# Patient Record
Sex: Male | Born: 1950 | Race: White | Hispanic: No | Marital: Single | Smoking: Never smoker
Health system: Southern US, Community
[De-identification: ages and names within clinical notes are randomized; demographics above are authoritative.]

## PROBLEM LIST (undated history)

## (undated) DIAGNOSIS — E785 Hyperlipidemia, unspecified: Secondary | ICD-10-CM

## (undated) DIAGNOSIS — C61 Malignant neoplasm of prostate: Secondary | ICD-10-CM

## (undated) DIAGNOSIS — I4891 Unspecified atrial fibrillation: Secondary | ICD-10-CM

## (undated) DIAGNOSIS — I10 Essential (primary) hypertension: Secondary | ICD-10-CM

## (undated) DIAGNOSIS — I509 Heart failure, unspecified: Secondary | ICD-10-CM

## (undated) DIAGNOSIS — M25561 Pain in right knee: Secondary | ICD-10-CM

## (undated) DIAGNOSIS — M1711 Unilateral primary osteoarthritis, right knee: Secondary | ICD-10-CM

## (undated) DIAGNOSIS — D122 Benign neoplasm of ascending colon: Secondary | ICD-10-CM

## (undated) DIAGNOSIS — N200 Calculus of kidney: Secondary | ICD-10-CM

## (undated) HISTORY — DX: Malignant neoplasm of prostate: C61

---

## 2012-03-12 LAB — AMB POC URINALYSIS DIP STICK AUTO W/O MICRO
Bilirubin (UA POC): NEGATIVE
Glucose (UA POC): NEGATIVE
Ketones (UA POC): NEGATIVE
Leukocyte esterase (UA POC): NEGATIVE
Nitrites (UA POC): NEGATIVE
Specific gravity (UA POC): 1.02 (ref 1.001–1.035)
Urobilinogen (UA POC): 0.2 (ref 0.2–1)
pH (UA POC): 6 (ref 4.6–8.0)

## 2012-03-12 MED ORDER — OXYCODONE-ACETAMINOPHEN 5 MG-325 MG TAB
5-325 mg | ORAL_TABLET | Freq: Four times a day (QID) | ORAL | Status: DC | PRN
Start: 2012-03-12 — End: 2017-11-30

## 2012-03-14 LAB — CULTURE, URINE
Urine Culture, Routine: NO GROWTH
Urine Culture, Routine: NO GROWTH

## 2012-03-15 ENCOUNTER — Encounter

## 2012-03-15 LAB — AMB POC URINALYSIS DIP STICK AUTO W/O MICRO
Glucose (UA POC): NEGATIVE
Ketones (UA POC): NEGATIVE
Leukocyte esterase (UA POC): NEGATIVE
Nitrites (UA POC): NEGATIVE
Specific gravity (UA POC): 1.03 (ref 1.001–1.035)
Urobilinogen (UA POC): 0.2 (ref 0.2–1)
pH (UA POC): 6 (ref 4.6–8.0)

## 2012-03-15 LAB — URINE CYTOLOGY

## 2012-03-15 NOTE — Progress Notes (Signed)
Assessment/Plan:             1. Calculus of kidney  AMB POC URINALYSIS DIP STICK AUTO W/O MICRO, AMB POC XRAY, ABDOMEN; SINGLE ANTEROP, CULTURE, URINE, URINE CYTOLOGY, CT ABD PELV WO CONT   2. Hematuria, unspecified  CULTURE, URINE, URINE CYTOLOGY, CT ABD PELV WO CONT     Pt to get a CT and F/U on Monday      Subjective:   Antonio Perez is a 61 y.o. Caucasian male who is referred by:  Nathaniel Man, MD for evaluation and treatment of kidney stone.  Pt having left flank pain, and gross hematuria.  He does have a prior hx of kidney stones, as well as prostate cancer.  He is on Xaralto and according to his cardiologist he will need to be off it for 2 days prior to any surgery.  No recent films except for a Renal US.    There is no problem list on file for this patient.    There are no active problems to display for this patient.    Current Outpatient Prescriptions   Medication Sig Dispense Refill   ??? rivaroxaban (XARELTO) 20 mg Tab tablet Take  by mouth daily.       ??? carvedilol (COREG) 25 mg tablet Take 25 mg by mouth two (2) times daily (with meals).       ??? oxyCODONE-acetaminophen (PERCOCET) 5-325 mg per tablet Take 1 Tab by mouth every six (6) hours as needed for Pain.  45 Tab  0     Allergies   Allergen Reactions   ??? Other Other (comments)     Seasonal allergies     Past Medical History   Diagnosis Date   ??? Essential hypertension    ??? Atrial fibrillation 2011   ??? Prostate cancer 2000     Past Surgical History   Procedure Date   ??? Hx prostatectomy 2000     [place], [place]     History reviewed. No pertinent family history.  History   Substance Use Topics   ??? Smoking status: Never Smoker    ??? Smokeless tobacco: Not on file   ??? Alcohol Use: Yes      occ        Review of Systems  Const: Neg for fever, neg for chills, neg for weight loss, neg for change in appetite, neg for changes in energy level  Eyes: Neg for visual disturbance, neg for pain, neg for discharge  ENT: Neg for difficulty speaking, neg for pain with  swallowing, neg for hearing difficulty  Resp: Neg for Shortness of breath, neg for cough, neg for sputum, neg for hemoptysis  Cardio: Neg for chest pain, neg for rapid heartbeat, neg for irregular heartbeat  GU: Neg for history of kidney stones, neg for frequent UTI, neg for flank pain  GI: Neg for constipation, neg for diarrhea, neg for melena, neg for hematochezia  MSK: Neg for bone pain, neg for muscular weakness, neg for muscular tenderness  Skin: Neg for skin conditions, neg for skin rashes  Neuro: Neg for focal weakness, neg for numbness, neg for seizures  Psych: Neg for history of psychiatric illness  Endo: Neg for diabetes, neg for thyroid disease, neg for excessive urination, neg for excessive thirst, neg for heat intolerance  Lymph: Neg for frequent infections, neg for easy bruising, neg for  lymph node enlargement    Objective:     Estimated Body mass index is 28.76 kg/(m^2) as  calculated from the following:    Height as of this encounter: 6\' 2" (1.88 m).    Weight as of this encounter: 224 lb(101.606 kg).   BP 116/80   Ht 6\' 2"  (1.88 m)   Wt 224 lb (101.606 kg)   BMI 28.76 kg/m2  General appearance: alert, cooperative, no distress, appears stated age  Head: Normocephalic, without obvious abnormality, atraumatic  Neck: supple, symmetrical, trachea midline and no JVD  Back: symmetric, no curvature. ROM normal. No CVA tenderness.  Abdomen: normal findings: soft, non-tender  Extremities: extremities normal, atraumatic, no cyanosis or edema  Skin: Skin color, texture, turgor normal. No rashes or lesions  Neurologic: Grossly normal    Review of Labs, Medical Images, tracings or specimens:    Labs reviewed:   UA  Results for orders placed in visit on 03/12/12   AMB POC URINALYSIS DIP STICK AUTO W/O MICRO       Component Value Range    Color Yellow  (none)    Clarity Clear  (none)    Glucose Negative  (none)    Bilirubin Negative  (none)    Ketones Negative  (none)    Spec.Grav. 1.020  1.001 - 1.035    Blood 4+   (none)    pH 6.0  4.6 - 8.0    Protein, Urine 1+  Negative mg/dL    Urobilinogen 0.2 mg/dL  0.2 - 1    Nitrites Negative  (none)    Leukocyte esterase Negative  (none)   CULTURE, URINE       Component Value Range    Urine Culture, Routine No growth       Lytle Butte, NP

## 2012-03-15 NOTE — Progress Notes (Addendum)
Assessment/Plan:             1. Calculus of kidney  AMB POC URINALYSIS DIP STICK AUTO W/O MICRO   2. Hematuria  AMB POC URINALYSIS DIP STICK AUTO W/O MICRO     Went over films with Dr. Tye [place]  Pt to be set up for cysto, retrogrades, laserlitho, JJ stent left with Dr. Louretta Parma on Thursday  Pt understands that if the stone is embedded in the ureter a stent will be inserted and the stone taken care of at a later date  Risks of bleeding, infection and anesthesia explained  All questions answered         Subjective:   Antonio Perez is a 61 y.o. Caucasian male who is referred by:  Nathaniel Man, MD for evaluation and treatment of kidney stone.  Pt having left flank pain, and gross hematuria.  He does have a prior hx of kidney stones, as well as prostate cancer.  He is on Xaralto and according to his cardiologist he will need to be off it for 1 days prior to any surgery.  No recent films except for a Renal US.    CT   Collected Date & Time: 03/12/12 15:12      Result Name Results Units Reference Range   CT ABDOMEN/PELVIS W/O CONTRAST CAT 7160 - CT  ABDOMEN/PELVIS W(..)   CAT 7160 - CT ABDOMEN/PELVIS W/O CONTRAST - Mar 12 2012     RESULTS: History: Left flank pain. History of kidney stones and  prostate   carcinoma.      IMPRESSION: 15.7 mm left renal pelvic nonobstructing calculus.   Prostatectomy. Diverticulosis.    Comment: CT images of the abdomen and pelvis were obtained  without oral   or intravenous contrast. There no prior studies for comparison.    The lung bases are clear.    The liver is of mild low attenuation from fatty infiltration.    The spleen pancreas adrenal glands and gallbladder are  unremarkable.    A 15.7 x 6.1 mm left renal pelvic calculus is present. The there  is   slight prominence of the renal pelvis but no hydronephrosis. The  right   kidney, both ureters and bladder are unremarkable.    A prostatectomy has been performed.    Diverticula are noted in the sigmoid colon without  diverticulitis,  bowel   obstruction, free intraperitoneal air or free fluid.    Interpreting Physician: Odelia Gage III M.D.   Electronically Signed by / Date: Odelia Gage III M.D. Mar 12 2012         There is no problem list on file for this patient.    There are no active problems to display for this patient.    Current Outpatient Prescriptions   Medication Sig Dispense Refill   ??? rivaroxaban (XARELTO) 20 mg Tab tablet Take  by mouth daily.       ??? carvedilol (COREG) 25 mg tablet Take 25 mg by mouth two (2) times daily (with meals).       ??? oxyCODONE-acetaminophen (PERCOCET) 5-325 mg per tablet Take 1 Tab by mouth every six (6) hours as needed for Pain.  45 Tab  0     Allergies   Allergen Reactions   ??? Other Other (comments)     Seasonal allergies     Past Medical History   Diagnosis Date   ??? Essential hypertension    ??? Atrial fibrillation 2011   ???  Prostate cancer 2000     Past Surgical History   Procedure Date   ??? Hx prostatectomy 2000     , [place]     History reviewed. No pertinent family history.  History   Substance Use Topics   ??? Smoking status: Never Smoker    ??? Smokeless tobacco: Not on file   ??? Alcohol Use: Yes      occ        Review of Systems  Const: Neg for fever, neg for chills, neg for weight loss, neg for change in appetite, neg for changes in energy level  Eyes: Neg for visual disturbance, neg for pain, neg for discharge  ENT: Neg for difficulty speaking, neg for pain with swallowing, neg for hearing difficulty  Resp: Neg for Shortness of breath, neg for cough, neg for sputum, neg for hemoptysis  Cardio: Neg for chest pain, neg for rapid heartbeat, neg for irregular heartbeat  GU: Neg for history of kidney stones, neg for frequent UTI, neg for flank pain  GI: Neg for constipation, neg for diarrhea, neg for melena, neg for hematochezia  MSK: Neg for bone pain, neg for muscular weakness, neg for muscular tenderness  Skin: Neg for skin conditions, neg for skin rashes  Neuro: Neg for focal weakness, neg  for numbness, neg for seizures  Psych: Neg for history of psychiatric illness  Endo: Neg for diabetes, neg for thyroid disease, neg for excessive urination, neg for excessive thirst, neg for heat intolerance  Lymph: Neg for frequent infections, neg for easy bruising, neg for  lymph node enlargement    Objective:     Estimated Body mass index is 28.76 kg/(m^2) as calculated from the following:    Height as of this encounter: 6\' 2" (1.88 m).    Weight as of this encounter: 224 lb(101.606 kg).   BP 100/78   Ht 6\' 2"  (1.88 m)   Wt 224 lb (101.606 kg)   BMI 28.76 kg/m2  General appearance: alert, cooperative, no distress, appears stated age  Head: Normocephalic, without obvious abnormality, atraumatic  Neck: supple, symmetrical, trachea midline and no JVD  Back: symmetric, no curvature. ROM normal. No CVA tenderness.  Abdomen: normal findings: soft, non-tender  Lungs: clear  Heart: reg rate and rhythm  Extremities: extremities normal, atraumatic, no cyanosis or edema  Skin: Skin color, texture, turgor normal. No rashes or lesions  Neurologic: Grossly normal    Review of Labs, Medical Images, tracings or specimens:    Labs reviewed:   UA  Results for orders placed in visit on 03/15/12   AMB POC URINALYSIS DIP STICK AUTO W/O MICRO       Component Value Range    Color Yellow  (none)    Clarity Clear  (none)    Glucose Negative  (none)    Bilirubin 1+  (none)    Ketones Negative  (none)    Spec.Grav. 1.030  1.001 - 1.035    Blood 4+  (none)    pH 6.0  4.6 - 8.0    Protein, Urine 1+  Negative mg/dL    Urobilinogen 0.2 mg/dL  0.2 - 1    Nitrites Negative  (none)    Leukocyte esterase Negative  (none)     Lytle Butte, NP

## 2012-03-22 NOTE — Telephone Encounter (Signed)
Called patient and asked that they call the office to schedule a cysto stent removal with Dr Louretta Parma  -

## 2012-03-25 NOTE — Progress Notes (Signed)
CYSTOSCOPY PROCEDURE    Patient Name: Antonio Perez Date of Procedure: 03/25/2012   Procedure: Cystoscopy with stent removal     Surgeon: Etheleen Nicks, MD  Assistant: None    This procedure has been fully reviewed with the patient, and written informed consent has been obtained.    History/Imaging:   Antonio Perez is a 61 y.o. white male with h/o Left renal pelvis stone 1.5cm  03/18/12 s/p left ureteroscopy and laser lithotripsy.  Here today for stent removal.        Results for orders placed in visit on 03/15/12   AMB POC URINALYSIS DIP STICK AUTO W/O MICRO       Component Value Range    Color Yellow  (none)    Clarity Clear  (none)    Glucose Negative  (none)    Bilirubin 1+  (none)    Ketones Negative  (none)    Spec.Grav. 1.030  1.001 - 1.035    Blood 4+  (none)    pH 6.0  4.6 - 8.0    Protein, Urine 1+  Negative mg/dL    Urobilinogen 0.2 mg/dL  0.2 - 1    Nitrites Negative  (none)    Leukocyte esterase Negative  (none)       Exam:  A time-out was performed to properly identify patient, the procedure to be performed.   Lidocaine Jelly: Yes Scope: Flexible cystoscope  Meatus: Normal  Urethra: Normal   Prostate:Obstructing (mild)   Bladder neck: Open   -- JJ ureteral stent removed w/ no difficulty      Assessment:  1. Left renal pelvis stone 1.5cm  03/18/12 s/p left ureteroscopy and laser lithotripsy.       Plan:  1. Stone prevention discussed:  A) Increase fluid consumption to make at least 2-2.5 litre of urine per day or consume at least 3 liters of fluid daily.  B) Decrease dietary Salt (or Sodium) intake  C) Avoid dark drinks (ie, Coffee, Tea, Colas, etc), nuts, chocolate  D) Lemonade: 4 oz lemon juice mixed with 2 litre water, sweetened to taste  F/U 6 months with KUB to assess for further stones.    Antibiotic provided: PO Cipro  __________________________________  Physician: Etheleen Nicks, MD     Cc;   Nathaniel Man, MD

## 2012-03-25 NOTE — Telephone Encounter (Signed)
03/25/12 pt states he would prefer to call us back to schedule his 16mo fu w/Dr Louretta Parma. Gave pt office card & my dl ext./rg

## 2017-12-01 ENCOUNTER — Ambulatory Visit: Primary: Cardiovascular Disease

## 2017-12-01 ENCOUNTER — Inpatient Hospital Stay: Payer: MEDICARE

## 2017-12-01 MED ORDER — PROPOFOL 10 MG/ML IV EMUL
10 mg/mL | INTRAVENOUS | Status: DC | PRN
Start: 2017-12-01 — End: 2017-12-01
  Administered 2017-12-01 (×3): via INTRAVENOUS

## 2017-12-01 MED ORDER — LIDOCAINE 4 % TOPICAL SOLN
4 % (0 mg/mL) | Status: DC | PRN
Start: 2017-12-01 — End: 2017-12-01
  Administered 2017-12-01: 14:00:00 via NASAL

## 2017-12-01 MED ORDER — PROPOFOL 10 MG/ML IV EMUL
10 mg/mL | INTRAVENOUS | Status: DC | PRN
Start: 2017-12-01 — End: 2017-12-01
  Administered 2017-12-01 (×2): via INTRAVENOUS

## 2017-12-01 MED ORDER — LIDOCAINE (PF) 20 MG/ML (2 %) IJ SOLN
20 mg/mL (2 %) | INTRAMUSCULAR | Status: DC | PRN
Start: 2017-12-01 — End: 2017-12-01
  Administered 2017-12-01: 14:00:00 via INTRAVENOUS

## 2017-12-01 MED ORDER — SODIUM CHLORIDE 0.9 % IJ SYRG
Freq: Three times a day (TID) | INTRAMUSCULAR | Status: DC
Start: 2017-12-01 — End: 2017-12-01

## 2017-12-01 MED ORDER — SODIUM CHLORIDE 0.9 % IJ SYRG
INTRAMUSCULAR | Status: DC | PRN
Start: 2017-12-01 — End: 2017-12-01

## 2017-12-01 MED ORDER — MIDAZOLAM 1 MG/ML IJ SOLN
1 mg/mL | INTRAMUSCULAR | Status: DC | PRN
Start: 2017-12-01 — End: 2017-12-01

## 2017-12-01 MED ORDER — LACTATED RINGERS IV
INTRAVENOUS | Status: DC | PRN
Start: 2017-12-01 — End: 2017-12-01
  Administered 2017-12-01: 14:00:00 via INTRAVENOUS

## 2017-12-01 MED ORDER — OMEPRAZOLE 20 MG CAP, DELAYED RELEASE
20 mg | ORAL_CAPSULE | Freq: Every day | ORAL | 5 refills | Status: AC
Start: 2017-12-01 — End: 2017-12-31

## 2017-12-01 MED ORDER — FENTANYL CITRATE (PF) 50 MCG/ML IJ SOLN
50 mcg/mL | INTRAMUSCULAR | Status: DC | PRN
Start: 2017-12-01 — End: 2017-12-01

## 2017-12-01 NOTE — H&P (Signed)
History and Physical    Antonio Perez  04-11-1951  [id number]  [id number]    Pre-Procedure Diagnosis:  Left lower quadrant pain [R10.32]      Evaluation of past illnesses, surgeries, or injuries:   YES  Past Medical History:   Diagnosis Date   ??? Atrial fibrillation ([place]) 2011   ??? Essential hypertension    ??? Kidney stone    ??? Prostate cancer ([place]) 2000     Past Surgical History:   Procedure Laterality Date   ??? HX LITHOTRIPSY     ??? HX PROSTATECTOMY  2000    [place], [place]   ??? HX UROLOGICAL      stent insertion   ??? HX UROLOGICAL      stent removal       Allergies:    Allergies   Allergen Reactions   ??? Other Medication Other (comments)     Seasonal allergies       Previous reactions to sedation/analgesia?  NO    Review of current medications, supplement, herbals and nutraceuticals complete:  YES  Current Outpatient Medications   Medication Sig Dispense Refill   ??? apixaban (ELIQUIS) 5 mg tablet Take 5 mg by mouth two (2) times a day.     ??? carvedilol (COREG) 25 mg tablet Take 25 mg by mouth two (2) times daily (with meals).            Pertinent labs reviewed?  YES    History of substance abuse?  NO  Family History   Problem Relation Age of Onset   ??? Thyroid Disease Brother      Social History     Socioeconomic History   ??? Marital status: MARRIED     Spouse name: Not on file   ??? Number of children: Not on file   ??? Years of education: Not on file   ??? Highest education level: Not on file   Occupational History   ??? Not on file   Social Needs   ??? Financial resource strain: Not on file   ??? Food insecurity:     Worry: Not on file     Inability: Not on file   ??? Transportation needs:     Medical: Not on file     Non-medical: Not on file   Tobacco Use   ??? Smoking status: Never Smoker   ??? Smokeless tobacco: Never Used   Substance and Sexual Activity   ??? Alcohol use: Yes     Comment: occ   ??? Drug use: No   ??? Sexual activity: Not on file   Lifestyle   ??? Physical activity:     Days per week: Not on file      Minutes per session: Not on file   ??? Stress: Not on file   Relationships   ??? Social connections:     Talks on phone: Not on file     Gets together: Not on file     Attends religious service: Not on file     Active member of club or organization: Not on file     Attends meetings of clubs or organizations: Not on file     Relationship status: Not on file   ??? Intimate partner violence:     Fear of current or ex partner: Not on file     Emotionally abused: Not on file     Physically abused: Not on file     Forced sexual activity: Not on file  Other Topics Concern   ??? Not on file   Social History Narrative   ??? Not on file       Cardiac Status:  WNL    Mental Status:  WNL     Pulmonary Status:  WNL    NPO:  5-8    Assessment/Impression: Epigastric pain, LLQ pain    Plan of treatment: EGD and colonoscopy        Barkley Bruns, MD  12/01/2017  7:56 AM

## 2017-12-01 NOTE — Other (Addendum)
Waiting for Dr. Emeline General to speak to patient.  1141-Discharge instructions reviewed with patient and family. Verbalized understanding, denied any questions. Written copy of instructions given to family.

## 2017-12-01 NOTE — Anesthesia Post-Procedure Evaluation (Signed)
Procedure(s):  ESOPHAGOGASTRODUODENOSCOPY (EGD) with bx  COLONOSCOPY, DIAGNOSTIC with cold snare polypectomy.    general    Anesthesia Post Evaluation      Multimodal analgesia: multimodal analgesia not used between 6 hours prior to anesthesia start to PACU discharge  Patient location during evaluation: PACU  Patient participation: complete - patient participated  Level of consciousness: awake  Pain management: satisfactory to patient  Airway patency: patent  Anesthetic complications: no  Cardiovascular status: acceptable  Respiratory status: acceptable  Hydration status: acceptable  Post anesthesia nausea and vomiting:  none      Vitals Value Taken Time   BP 121/83 12/01/2017 10:31 AM   Temp     Pulse     Resp     SpO2 96 % 12/01/2017 10:36 AM   Vitals shown include unvalidated device data.

## 2017-12-01 NOTE — Anesthesia Pre-Procedure Evaluation (Addendum)
Relevant Problems   No relevant active problems       Anesthetic History   No history of anesthetic complications            Review of Systems / Medical History  Patient summary reviewed, nursing notes reviewed and pertinent labs reviewed    Pulmonary  Within defined limits                 Neuro/Psych   Within defined limits           Cardiovascular    Hypertension        Dysrhythmias : atrial fibrillation        Comments: Bb this am   GI/Hepatic/Renal  Within defined limits              Endo/Other  Within defined limits           Other Findings              Physical Exam    Airway  Mallampati: II  TM Distance: 4 - 6 cm  Neck ROM: normal range of motion   Mouth opening: Normal     Cardiovascular  Regular rate and rhythm,  S1 and S2 normal,  no murmur, click, rub, or gallop             Dental  No notable dental hx       Pulmonary  Breath sounds clear to auscultation               Abdominal  GI exam deferred       Other Findings            Anesthetic Plan    ASA: 2  Anesthesia type: general          Induction: Intravenous  Anesthetic plan and risks discussed with: Patient      I have informed the patient or their guardian of the nature and purpose of the type of anesthesia, the reasonable alternative anesthetic methods, pertinent foreseeable risks involved and the possibility of complications. I have explained that an alternative form of anesthesia may be required by unexpected conditions arising before or during the procedure. It is understood that general anesthesia may be required for safety or comfort. Questions have been answered to the satisfaction of the patient who accepts the risks and agrees to proceed as planned. The above anesthetic review of medical history, physical exam, tests, assessment, subsequent anesthetic plan and consent have been accomplished pre-procedure.

## 2017-12-03 MED FILL — LACTATED RINGERS IV: INTRAVENOUS | Qty: 500

## 2017-12-03 MED FILL — PROPOFOL 10 MG/ML IV EMUL: 10 mg/mL | INTRAVENOUS | Qty: 150

## 2017-12-03 MED FILL — XYLOCAINE-MPF 20 MG/ML (2 %) INJECTION SOLUTION: 20 mg/mL (2 %) | INTRAMUSCULAR | Qty: 50

## 2017-12-03 MED FILL — LIDOCAINE 4 % TOPICAL SOLN: 4 % (0 mg/mL) | Qty: 3

## 2017-12-03 MED FILL — DIPRIVAN 10 MG/ML INTRAVENOUS EMULSION: 10 mg/mL | INTRAVENOUS | Qty: 380.91

## 2019-05-12 ENCOUNTER — Other Ambulatory Visit: Payer: Self-pay

## 2019-05-12 ENCOUNTER — Emergency Department (INDEPENDENT_AMBULATORY_CARE_PROVIDER_SITE_OTHER): Payer: Medicare Other

## 2019-05-12 ENCOUNTER — Emergency Department (INDEPENDENT_AMBULATORY_CARE_PROVIDER_SITE_OTHER)
Admission: EM | Admit: 2019-05-12 | Discharge: 2019-05-12 | Disposition: A | Discharge status: Home / Self Care | Payer: Medicare Other | Source: Home / Self Care

## 2019-05-12 DIAGNOSIS — R509 Fever, unspecified: Secondary | ICD-10-CM

## 2019-05-12 DIAGNOSIS — R000 Tachycardia, unspecified: Secondary | ICD-10-CM

## 2019-05-12 DIAGNOSIS — R062 Wheezing: Secondary | ICD-10-CM

## 2019-05-12 DIAGNOSIS — J9811 Atelectasis: Secondary | ICD-10-CM | POA: Diagnosis not present

## 2019-05-12 DIAGNOSIS — D72829 Elevated white blood cell count, unspecified: Secondary | ICD-10-CM

## 2019-05-12 LAB — POCT CBC W AUTO DIFF (K'VILLE URGENT CARE)

## 2019-05-12 LAB — POCT INFLUENZA A/B
Influenza A, POC: NEGATIVE
Influenza B, POC: NEGATIVE

## 2019-05-12 MED ORDER — ACETAMINOPHEN 325 MG PO TABS
975.0000 mg | ORAL_TABLET | Freq: Once | ORAL | Status: AC
  Administered 2019-05-12: 975 mg via ORAL

## 2019-05-12 NOTE — Discharge Instructions (Signed)
°  Your heart rate, elevated white blood cells, lower oxygen level, wheeze in your lungs, and chest x-ray concerning for possible complications from possible covid.   After speaking with the supervising physician, it is recommended you are evaluated further this evening in the emergency department.  Please drive directly to the hospital for a further workup and treatment.

## 2019-05-12 NOTE — ED Triage Notes (Signed)
Pt c/o flu likes sxs ever since getting the flu shot on Tues. Denies cough, SOB, N/V/D.

## 2019-05-12 NOTE — ED Provider Notes (Signed)
[facility]    CSN: [id number] Arrival date & time: 05/12/19  1710      History   Chief Complaint Chief Complaint  Patient presents with  . Fever    HPI Marven Veley is a 68 y.o. male.   HPI Reeve Turnley is a 68 y.o. male presenting to UC with c/o fever and mild chills since getting the flu vaccine 2 days ago.  Denies cough, congestion, SOB, or chest tightness. Temp at home of 100*F, mild body aches. Denies n/v/d. No sick contacts or recent travel.    History reviewed. No pertinent past medical history.  There are no active problems to display for this patient.   History reviewed. No pertinent surgical history.     Home Medications    Prior to Admission medications   Medication Sig Start Date End Date Taking? Authorizing Provider  carvedilol (COREG) 12.5 MG tablet Take by mouth.    [provider]  cetirizine (RA CETIRIZINE) 10 MG tablet Take by mouth.    [provider]  ELIQUIS 5 MG TABS tablet  04/22/19   [provider]    Family History History reviewed. No pertinent family history.  Social History Social History   Tobacco Use  . Smoking status: Never Smoker  . Smokeless tobacco: Never Used  Substance Use Topics  . Alcohol use: Yes    Comment: occ  . Drug use: Not on file     Allergies   Ambien  [zolpidem]   Review of Systems Review of Systems  Constitutional: Positive for chills, fatigue and fever.  HENT: Negative for congestion, ear pain, sore throat, trouble swallowing and voice change.   Respiratory: Negative for cough and shortness of breath.   Cardiovascular: Negative for chest pain and palpitations.  Gastrointestinal: Negative for abdominal pain, diarrhea, nausea and vomiting.  Musculoskeletal: Positive for arthralgias and myalgias. Negative for back pain.  Skin: Negative for rash.  Neurological: Negative for dizziness, light-headedness and headaches.     Physical Exam Triage Vital Signs  ED Triage Vitals [05/12/19 1757]  Enc Vitals Group     BP 120/83     Pulse Rate (!) 156     Resp 18     Temp 100.1 F (37.8 C)     Temp Source Oral     SpO2 93 %     Weight      Height      Head Circumference      Peak Flow      Pain Score 2     Pain Loc      Pain Edu?      Excl. in [place]?    No data found.  Updated Vital Signs BP 120/83 (BP Location: Right Arm)   Pulse (!) 108   Temp 100.1 F (37.8 C) (Oral)   Resp 18   SpO2 94%     Physical Exam Vitals signs and nursing note reviewed.  Constitutional:      Appearance: Normal appearance. He is well-developed.  HENT:     Head: Normocephalic and atraumatic.     Right Ear: Tympanic membrane and ear canal normal.     Left Ear: Tympanic membrane and ear canal normal.     Nose: Nose normal.     Mouth/Throat:     Mouth: Mucous membranes are moist.     Pharynx: Oropharynx is clear.  Eyes:     Extraocular Movements: Extraocular movements intact.     Conjunctiva/sclera: Conjunctivae normal.  Neck:  Musculoskeletal: Normal range of motion and neck supple.  Cardiovascular:     Rate and Rhythm: Regular rhythm. Tachycardia present.  Pulmonary:     Effort: Pulmonary effort is normal. No respiratory distress.     Breath sounds: No stridor. Wheezing (worse on Right side) present. No rhonchi.  Musculoskeletal: Normal range of motion.  Lymphadenopathy:     Cervical: No cervical adenopathy.  Skin:    General: Skin is warm and dry.     Capillary Refill: Capillary refill takes less than 2 seconds.  Neurological:     Mental Status: He is alert and oriented to person, place, and time.  Psychiatric:        Behavior: Behavior normal.      UC Treatments / Results  Labs (all labs ordered are listed, but only abnormal results are displayed) Labs Reviewed  NOVEL CORONAVIRUS, NAA  POCT INFLUENZA A/B  POCT CBC W AUTO DIFF ([facility])    EKG   Radiology Dg Chest 2 View  Result Date: 05/12/2019 CLINICAL DATA:   Fever, body aches, abdominal pain EXAM: CHEST - 2 VIEW COMPARISON:  None FINDINGS: Enlargement of cardiac silhouette. Mediastinal contours and pulmonary vascularity normal. Subsegmental atelectasis RIGHT base. Lungs otherwise clear. No pleural effusion or pneumothorax. Mild endplate spur formation lower thoracic spine. Bones appear demineralized. IMPRESSION: Enlargement of cardiac silhouette with mild RIGHT basilar atelectasis. Electronically Signed   By: Ulyses Southward M.D.   On: 05/12/2019 18:03    Procedures Procedures (including critical care time)  Medications Ordered in UC Medications  acetaminophen (TYLENOL) tablet 975 mg (975 mg Oral Given 05/12/19 1745)    Initial Impression / Assessment and Plan / UC Course  I have reviewed the triage vital signs and the nursing notes.  Pertinent labs & imaging results that were available during my care of the patient were reviewed by me and considered in my medical decision making (see chart for details).     Rapid flu: NEGATIVE CBC, WBC- 17.9 Covid test pending.  Consulted with Dr. Cathren Harsh, recommend pt be evaluated further in emergency department given initial tachycardia of 156 then elevated temp. Slightly low O2, concern for possible Covid-19 complications, endocarditis, or other underlying infection requiring further evaluation and treatment.  Pt agreeable to drive himself to [facility] for further evaluation and treatment.    Final Clinical Impressions(s) / UC Diagnoses   Final diagnoses:  Fever, unspecified  Tachycardia  Wheeze  Leukocytosis, unspecified type     Discharge Instructions      Your heart rate, elevated white blood cells, lower oxygen level, wheeze in your lungs, and chest x-ray concerning for possible complications from possible covid.   After speaking with the supervising physician, it is recommended you are evaluated further this evening in the emergency department.  Please drive directly to the  hospital for a further workup and treatment.     ED Prescriptions    None     PDMP not reviewed this encounter.   Lurene Shadow, [place] 05/12/19 [phone]

## 2019-05-14 LAB — NOVEL CORONAVIRUS, NAA: SARS-CoV-2, NAA: NOT DETECTED

## 2019-08-05 ENCOUNTER — Ambulatory Visit
Admit: 2019-08-05 | Discharge: 2019-08-05 | Payer: MEDICARE | Attending: Orthopaedic Surgery | Primary: Cardiovascular Disease

## 2019-08-05 ENCOUNTER — Ambulatory Visit: Attending: Orthopaedic Surgery

## 2019-08-05 DIAGNOSIS — M1711 Unilateral primary osteoarthritis, right knee: Secondary | ICD-10-CM

## 2019-08-05 NOTE — Progress Notes (Signed)
Name: Antonio Perez    DOB: 01-17-1951     Service Dept: [facility]    Patient's Pharmacies:    [place]  [address]  [place] [address]  Phone: [phone] Fax: [phone]       Chief Complaint   Patient presents with   ??? Knee Pain     right        Visit Vitals  Ht 6\' 2"  (1.88 m)   Wt 230 lb (104.3 kg)   BMI 29.53 kg/m??      Allergies   Allergen Reactions   ??? Ambien [Zolpidem] Other (comments)     Hyperactivity    ??? Other Medication Other (comments)     Seasonal allergies      Current Outpatient Medications   Medication Sig Dispense Refill   ??? cetirizine (ZYRTEC) 10 mg tablet Take 10 mg by mouth daily.     ??? apixaban (ELIQUIS) 5 mg tablet Take 5 mg by mouth two (2) times a day.     ??? carvedilol (COREG) 25 mg tablet Take 25 mg by mouth two (2) times daily (with meals).        There is no problem list on file for this patient.     Family History   Problem Relation Age of Onset   ??? Thyroid Disease Brother       Social History     Socioeconomic History   ??? Marital status: MARRIED     Spouse name: Not on file   ??? Number of children: Not on file   ??? Years of education: Not on file   ??? Highest education level: Not on file   Tobacco Use   ??? Smoking status: Never Smoker   ??? Smokeless tobacco: Never Used   Substance and Sexual Activity   ??? Alcohol use: Yes     Comment: occ   ??? Drug use: No   ??? Sexual activity: Not Currently      Past Surgical History:   Procedure Laterality Date   ??? COLONOSCOPY N/A 12/01/2017    COLONOSCOPY, DIAGNOSTIC with cold snare polypectomy performed by Barkley Bruns, [facility] at [facility] ENDOSCOPY   ??? HX LITHOTRIPSY     ??? HX PROSTATECTOMY  2000    , [place]   ??? HX UROLOGICAL      stent insertion   ??? HX UROLOGICAL      stent removal      Past Medical History:   Diagnosis Date   ??? Atrial fibrillation ([place]) 2011   ??? Essential hypertension    ??? Heart disease    ??? High cholesterol     ??? Kidney stone    ??? Prostate cancer ([place]) 2000        I have reviewed and agree with [place] and ROS and intake form in chart and the record.     Review of Systems:   Patient is a pleasant appearing individual, appropriately dressed, well hydrated, well nourished, who is alert, appropriately oriented for age, and in no acute distress with a normal gait and normal affect who does not appear to be in any significant pain.     Physical Exam:  Right Knee -Decrease range of motion with flexion, Knee arc of greater than 50 degrees, Some crepitation, Grossly neurovascularly intact, Good cap refill, No skin lesion, Moderate swelling, No gross instability,  Some quadriceps weakness, Kellgren and Lawrence at least grade 3    Left Knee - Full Range of Motion, No crepitation, Grossly neurovascularly intact, Good cap refill, No skin lesion, No swelling, No gross instability, No quadriceps weakness     Encounter Diagnoses     ICD-10-CM ICD-9-CM   1. Primary osteoarthritis of right knee  M17.11 715.16   2. Chronic pain of right knee  M25.561 719.46    G89.29 338.29         HPI:  The patient is here with a chief complaint of right knee pain, been having it for a while now, progressively getting worse.  Nothing has helped.  Pain is 6/10.    ROS:  10-point review of systems is positive for instability.    X-rays are positive for severe OA.    Assessment/Plan:  1.  Right knee severe OA.    Plan would be for right total knee replacement, outpatient surgery, general medical and cardiac clearance, and we will go from there.        Return to Office:    Schedule For Surgery     Scribed by Derry Skill, MD as dictated by [name]. Posey Pronto, MD.  Documentation True and Accepted Tyechia Allmendinger A. Posey Pronto, MD

## 2019-08-05 NOTE — Patient Instructions (Signed)
Knee Arthritis: Care Instructions  Your Care Instructions     Knee arthritis is a breakdown of the cartilage that cushions your knee joint. When the cartilage wears down, your bones rub against each other. This causes pain and stiffness. Knee arthritis tends to get worse with time.  Treatment for knee arthritis involves reducing pain, making the leg muscles stronger, and staying at a healthy body weight. The treatment usually does not improve the health of the cartilage, but it can reduce pain and improve how well your knee works.  You can take simple measures to protect your knee joints, ease your pain, and help you stay active.  Follow-up care is a key part of your treatment and safety. Be sure to make and go to all appointments, and call your doctor if you are having problems. It's also a good idea to know your test results and keep a list of the medicines you take.  How can you care for yourself at home?  ?? Know that knee arthritis will cause more pain on some days than on others.  ?? Stay at a healthy weight. Lose weight if you are overweight. When you stand up, the pressure on your knees from every pound of body weight is multiplied four times. So if you lose 10 pounds, you will reduce the pressure on your knees by 40 pounds.  ?? Talk to your doctor or physical therapist about exercises that will help ease joint pain.  ? Stretch to help prevent stiffness and to prevent injury before you exercise. You may enjoy gentle forms of yoga to help keep your knee joints and muscles flexible.  ? Walk instead of jog.  ? Ride a bike. This makes your thigh muscles stronger and takes pressure off your knee.  ? Wear well-fitting and comfortable shoes.  ? Exercise in chest-deep water. This can help you exercise longer with less pain.  ? Avoid exercises that include squatting or kneeling. They can put a lot of strain on your knees.   ? Talk to your doctor to make sure that the exercise you do is not making the arthritis worse.  ?? Do not sit for long periods of time. Try to walk once in a while to keep your knee from getting stiff.  ?? Ask your doctor or physical therapist whether shoe inserts may reduce your arthritis pain.  ?? If you can afford it, get new athletic shoes at least every year. This can help reduce the strain on your knees.  ?? Use a device to help you do everyday activities.  ? A cane or walking stick can help you keep your balance when you walk. Hold the cane or walking stick in the hand opposite the painful knee.  ? If you feel like you may fall when you walk, try using crutches or a front-wheeled walker. These can prevent falls that could cause more damage to your knee.  ? A knee brace may help keep your knee stable and prevent pain.  ? You also can use other things to make life easier, such as a higher toilet seat and handrails in the bathtub or shower.  ?? Take pain medicines exactly as directed.  ? Do not wait until you are in severe pain. You will get better results if you take it sooner.  ? If you are not taking a prescription pain medicine, take an over-the-counter medicine such as acetaminophen (Tylenol), ibuprofen (Advil, Motrin), or naproxen (Aleve). Read and follow all instructions on   the label.  ? Do not take two or more pain medicines at the same time unless the doctor told you to. Many pain medicines have acetaminophen, which is Tylenol. Too much acetaminophen (Tylenol) can be harmful.  ? Tell your doctor if you take a blood thinner, have diabetes, or have allergies to shellfish.  ?? Ask your doctor if you might benefit from a shot of steroid medicine into your knee. This may provide pain relief for several months.   ?? Many people take the supplements glucosamine and chondroitin for osteoarthritis. Some people feel they help, but the medical research does not show that they work. Talk to your doctor before you take these supplements.  When should you call for help?   Call your doctor now or seek immediate medical care if:  ?? ?? You have sudden swelling, warmth, or pain in your knee.   ?? ?? You have knee pain and a fever or rash.   ?? ?? You have such bad pain that you cannot use your knee.   Watch closely for changes in your health, and be sure to contact your doctor if you have any problems.  Where can you learn more?  Go to https://www.healthwise.net/GoodHelpConnections  Enter W187 in the search box to learn more about "Knee Arthritis: Care Instructions."  Current as of: July 26, 2018??????????????????????????????Content Version: 12.6  ?? 2006-2020 Healthwise, Incorporated.   Care instructions adapted under license by [employer] (which disclaims liability or warranty for this information). If you have questions about a medical condition or this instruction, always ask your healthcare professional. Healthwise, Incorporated disclaims any warranty or liability for your use of this information.

## 2019-08-05 NOTE — Progress Notes (Signed)
Name: Antonio Perez    DOB: [birth year]     Service Dept: [facility]    Patient's Pharmacies:    [place], [address]  [address]  [place] [address]  Phone: [phone] Fax: [phone]       Chief Complaint   Patient presents with   ??? Knee Pain     right        Visit Vitals  Ht 6\' 2"  (1.88 m)   Wt 230 lb (104.3 kg)   BMI 29.53 kg/m??      Allergies   Allergen Reactions   ??? Ambien [Zolpidem] Other (comments)     Hyperactivity    ??? Other Medication Other (comments)     Seasonal allergies      Current Outpatient Medications   Medication Sig Dispense Refill   ??? cetirizine (ZYRTEC) 10 mg tablet Take 10 mg by mouth daily.     ??? apixaban (ELIQUIS) 5 mg tablet Take 5 mg by mouth two (2) times a day.     ??? carvedilol (COREG) 25 mg tablet Take 25 mg by mouth two (2) times daily (with meals).        There is no problem list on file for this patient.     Family History   Problem Relation Age of Onset   ??? Thyroid Disease Brother       Social History     Socioeconomic History   ??? Marital status: MARRIED     Spouse name: Not on file   ??? Number of children: Not on file   ??? Years of education: Not on file   ??? Highest education level: Not on file   Tobacco Use   ??? Smoking status: Never Smoker   ??? Smokeless tobacco: Never Used   Substance and Sexual Activity   ??? Alcohol use: Yes     Comment: occ   ??? Drug use: No   ??? Sexual activity: Not Currently      Past Surgical History:   Procedure Laterality Date   ??? COLONOSCOPY N/A 12/01/2017    COLONOSCOPY, DIAGNOSTIC with cold snare polypectomy performed by Barkley Bruns, MD at [facility] ENDOSCOPY   ??? HX LITHOTRIPSY     ??? HX PROSTATECTOMY  2000    [place], Antonio Perez   ??? HX UROLOGICAL      stent insertion   ??? HX UROLOGICAL      stent removal      Past Medical History:   Diagnosis Date   ??? Atrial fibrillation ([place]) 2011   ??? Essential hypertension    ??? Heart disease    ??? High cholesterol    ???  Kidney stone    ??? Prostate cancer ([place]) 2000        I have reviewed and agree with [place] and ROS and intake form in chart and the record.     Review of Systems:   Patient is a pleasant appearing individual, appropriately dressed, well hydrated, well nourished, who is alert, appropriately oriented for age, and in no acute distress with a normal gait and normal affect who does not appear to be in any significant pain.     Physical Exam:  Right Knee -Decrease range of motion with flexion, Knee arc of greater than 50 degrees, Some crepitation, Grossly neurovascularly intact, Good cap refill, No skin lesion, Moderate swelling, No gross instability,  Some quadriceps weakness, Kellgren and Lawrence at least grade 3    Left Knee - Full Range of Motion, No crepitation, Grossly neurovascularly intact, Good cap refill, No skin lesion, No swelling, No gross instability, No quadriceps weakness     Encounter Diagnoses     ICD-10-CM ICD-9-CM   1. Primary osteoarthritis of right knee  M17.11 715.16   2. Chronic pain of right knee  M25.561 719.46    G89.29 338.29         HPI:  The patient is here with a chief complaint of right knee pain, been having it for a while now, progressively getting worse.  Nothing has helped.  Pain is 6/10.    ROS:  10-point review of systems is positive for instability.    X-rays are positive for severe OA.    Assessment/Plan:  1.  Right knee severe OA.    Plan would be for right total knee replacement, outpatient surgery, general medical and cardiac clearance, and we will go from there.        Return to Office:    Schedule For Surgery     Scribed by Derry Skill, MD as dictated by Antonio Perez. Posey Pronto, MD.  Documentation True and Accepted Antonio Perez A. Posey Pronto, MD

## 2019-08-09 ENCOUNTER — Encounter

## 2019-09-15 ENCOUNTER — Encounter

## 2019-09-29 ENCOUNTER — Inpatient Hospital Stay: Admit: 2019-09-29 | Payer: MEDICARE | Primary: Cardiovascular Disease

## 2019-09-29 ENCOUNTER — Ambulatory Visit
Admit: 2019-09-29 | Discharge: 2019-09-29 | Payer: MEDICARE | Attending: Orthopaedic Surgery | Primary: Cardiovascular Disease

## 2019-09-29 ENCOUNTER — Ambulatory Visit: Attending: Orthopaedic Surgery

## 2019-09-29 DIAGNOSIS — M1711 Unilateral primary osteoarthritis, right knee: Secondary | ICD-10-CM

## 2019-09-29 DIAGNOSIS — Z01812 Encounter for preprocedural laboratory examination: Secondary | ICD-10-CM

## 2019-09-29 LAB — SARS-COV-2

## 2019-09-29 LAB — COVID-19

## 2019-09-29 MED ORDER — CEPHALEXIN 500 MG CAP
500 mg | ORAL_CAPSULE | Freq: Three times a day (TID) | ORAL | 0 refills | Status: AC
Start: 2019-09-29 — End: 2019-09-30

## 2019-09-29 MED ORDER — OXYCODONE-ACETAMINOPHEN 5 MG-325 MG TAB
5-325 mg | ORAL_TABLET | ORAL | 0 refills | Status: AC | PRN
Start: 2019-09-29 — End: 2019-10-13

## 2019-09-29 NOTE — Progress Notes (Signed)
Name: Antonio Perez    DOB: [birth year]     Service Dept: [facility]    Patient's Pharmacies:    [facility], [address]  [address]  [place] [address]  Phone: [phone] Fax: [phone]       Chief Complaint   Patient presents with   ??? Pre-op Exam     Knee        There were no vitals taken for this visit.   Allergies   Allergen Reactions   ??? Ambien [Zolpidem] Other (comments)     Hyperactivity    ??? Other Medication Other (comments)     Seasonal allergies      Current Outpatient Medications   Medication Sig Dispense Refill   ??? oxyCODONE-acetaminophen (Percocet) 5-325 mg per tablet Take 1 Tab by mouth every four to six (4-6) hours as needed for Pain for up to 14 days. Max Daily Amount: 6 Tabs. This Rx is to only be used after surgical procedure 30 Tab 0   ??? cephALEXin (Keflex) 500 mg capsule Take 1 Cap by mouth every eight (8) hours for 3 doses. Start antibiotics after surgery 3 Cap 0   ??? ALPRAZolam (XANAX) 0.5 mg tablet      ??? cetirizine (ZYRTEC) 10 mg tablet Take 10 mg by mouth daily.     ??? apixaban (ELIQUIS) 5 mg tablet Take 5 mg by mouth two (2) times a day.     ??? carvedilol (COREG) 25 mg tablet Take 25 mg by mouth two (2) times daily (with meals).        There is no problem list on file for this patient.     Family History   Problem Relation Age of Onset   ??? Thyroid Disease Brother       Social History     Socioeconomic History   ??? Marital status: MARRIED     Spouse name: Not on file   ??? Number of children: Not on file   ??? Years of education: Not on file   ??? Highest education level: Not on file   Tobacco Use   ??? Smoking status: Never Smoker   ??? Smokeless tobacco: Never Used   Substance and Sexual Activity   ??? Alcohol use: Yes     Comment: occ   ??? Drug use: No   ??? Sexual activity: Not Currently      Past Surgical History:   Procedure Laterality Date   ??? COLONOSCOPY N/A 12/01/2017     COLONOSCOPY, DIAGNOSTIC with cold snare polypectomy performed by Antonio Bruns, MD at [facility] ENDOSCOPY   ??? HX LITHOTRIPSY     ??? HX PROSTATECTOMY  2000    [place], [place]   ??? HX UROLOGICAL      stent insertion   ??? HX UROLOGICAL      stent removal      Past Medical History:   Diagnosis Date   ??? Atrial fibrillation ([place]) 2011   ??? Essential hypertension    ??? Heart disease    ??? High cholesterol    ??? Kidney stone    ??? Prostate cancer ([place]) 2000        I have reviewed and agree with [place] and ROS and intake form in chart and the record furthermore I have reviewed prior medical record(s) regarding this patients care during this appointment.     Review  of Systems:   Patient is a pleasant appearing individual, appropriately dressed, well hydrated, well nourished, who is alert, appropriately oriented for age, and in no acute distress with a normal gait and normal affect who does not appear to be in any significant pain.     Physical Exam:  Right Knee -Decrease range of motion with flexion, Knee arc of greater than 50 degrees, Some crepitation, Grossly neurovascularly intact, Good cap refill, No skin lesion, Moderate swelling, No gross instability, Some quadriceps weakness, Kellgren and Lawrence at least grade 3    Left Knee - Full Range of Motion, No crepitation, Grossly neurovascularly intact, Good cap refill, No skin lesion, No swelling, No gross instability, No quadriceps weakness     Inpatient status: The patient has admitted to severe pain in the affected knee and due to such pain they are unable to complete activities of daily living at home and/or work on a regular basis where conservative treatments have failed. After extensive discussion with the patient, they have chosen to receive a total knee replacement with the expectation of inpatient procedure. Their dependent functional status (i.e. lack of capable support and safety at home, pain management, comorbities, or difficulty ambulating with assistive walking devices) would deem them a candidate for an inpatient stay. The patient acknowledges and understand the plan.    The risks of surgery were explained to the patient which include but not limited to infection, nerve injury, artery injury, tendon injury, poor result, poor wound healing, unforeseen incidence, bleeding, infection, nerve damage, failure to improve, worsening of symptoms, morbidity, and mortality risks were explained. All questions were answered. Patient was told of no guarantees. Patient accepts all risks and benefits. A consent for surgery will be documented and signed by the patient or a legal guardian. All questions were answered. The procedure was explained in detail.     The patient was counseled about the risks of contracting Covid-19 during their perioperative period and any recovery window from their procedure. The patient was made aware that contracting Covid-19 may worsen their prognosis for recovering from their procedure and lend to a higher morbidity and/or mortality risk. All material risks, benefits, and reasonable alternatives including postponing the procedure were discussed. The patient DOES wish to proceed with their procedure at this time.     Encounter Diagnoses     ICD-10-CM ICD-9-CM   1. Primary osteoarthritis of right knee  M17.11 715.16       HPI:   The patient is here with a chief complaint of right knee pain, dull throbbing pain, progressively getting worse.  Pain is 3/10.  Failed conservative treatment.    ROS:  10-point review of systems is positive for nighttime pain.    X-rays of the right knee are positive for severe OA.    Assessment/Plan:  Plan would be for right total knee replacement with Percocet, Keflex, and Eliquis for postop DVT prophylaxis.  He is cleared from his cardiologist.        Return to Office:   Follow-up and Dispositions    ?? Return for already sch for surgery.           Scribed by Antonio Skill, MD as dictated by Antonio Perez. Posey Pronto, MD.  Documentation True and Accepted Antonio Karg A. Posey Pronto, MD

## 2019-09-29 NOTE — H&P (View-Only) (Signed)
Name: Antonio Perez    DOB: 1951/01/30     Service Dept: [facility]    Patient's Pharmacies:    [facility], [address]  [address]  [place] [address]  Phone: [phone] Fax: [phone]       Chief Complaint   Patient presents with   ??? Pre-op Exam     Knee        There were no vitals taken for this visit.   Allergies   Allergen Reactions   ??? Ambien [Zolpidem] Other (comments)     Hyperactivity    ??? Other Medication Other (comments)     Seasonal allergies      Current Outpatient Medications   Medication Sig Dispense Refill   ??? oxyCODONE-acetaminophen (Percocet) 5-325 mg per tablet Take 1 Tab by mouth every four to six (4-6) hours as needed for Pain for up to 14 days. Max Daily Amount: 6 Tabs. This Rx is to only be used after surgical procedure 30 Tab 0   ??? cephALEXin (Keflex) 500 mg capsule Take 1 Cap by mouth every eight (8) hours for 3 doses. Start antibiotics after surgery 3 Cap 0   ??? ALPRAZolam (XANAX) 0.5 mg tablet      ??? cetirizine (ZYRTEC) 10 mg tablet Take 10 mg by mouth daily.     ??? apixaban (ELIQUIS) 5 mg tablet Take 5 mg by mouth two (2) times a day.     ??? carvedilol (COREG) 25 mg tablet Take 25 mg by mouth two (2) times daily (with meals).        There is no problem list on file for this patient.     Family History   Problem Relation Age of Onset   ??? Thyroid Disease Brother       Social History     Socioeconomic History   ??? Marital status: MARRIED     Spouse name: Not on file   ??? Number of children: Not on file   ??? Years of education: Not on file   ??? Highest education level: Not on file   Tobacco Use   ??? Smoking status: Never Smoker   ??? Smokeless tobacco: Never Used   Substance and Sexual Activity   ??? Alcohol use: Yes     Comment: occ   ??? Drug use: No   ??? Sexual activity: Not Currently      Past Surgical History:   Procedure Laterality Date   ??? COLONOSCOPY N/A 12/01/2017     COLONOSCOPY, DIAGNOSTIC with cold snare polypectomy performed by Barkley Bruns, MD at [facility] ENDOSCOPY   ??? HX LITHOTRIPSY     ??? HX PROSTATECTOMY  2000    [place], [place]   ??? HX UROLOGICAL      stent insertion   ??? HX UROLOGICAL      stent removal      Past Medical History:   Diagnosis Date   ??? Atrial fibrillation ([place]) 2011   ??? Essential hypertension    ??? Heart disease    ??? High cholesterol    ??? Kidney stone    ??? Prostate cancer ([place]) 2000        I have reviewed and agree with Antonio Perez and ROS and intake form in chart and the record furthermore I have reviewed prior medical record(s) regarding this patients care during this appointment.     Review  of Systems:   Patient is a pleasant appearing individual, appropriately dressed, well hydrated, well nourished, who is alert, appropriately oriented for age, and in no acute distress with a normal gait and normal affect who does not appear to be in any significant pain.     Physical Exam:  Right Knee -Decrease range of motion with flexion, Knee arc of greater than 50 degrees, Some crepitation, Grossly neurovascularly intact, Good cap refill, No skin lesion, Moderate swelling, No gross instability, Some quadriceps weakness, Kellgren and Lawrence at least grade 3    Left Knee - Full Range of Motion, No crepitation, Grossly neurovascularly intact, Good cap refill, No skin lesion, No swelling, No gross instability, No quadriceps weakness     Inpatient status: The patient has admitted to severe pain in the affected knee and due to such pain they are unable to complete activities of daily living at home and/or work on a regular basis where conservative treatments have failed. After extensive discussion with the patient, they have chosen to receive a total knee replacement with the expectation of inpatient procedure. Their dependent functional status (i.e. lack of capable support and safety at home, pain management, comorbities, or difficulty ambulating with assistive walking devices) would deem them a candidate for an inpatient stay. The patient acknowledges and understand the plan.    The risks of surgery were explained to the patient which include but not limited to infection, nerve injury, artery injury, tendon injury, poor result, poor wound healing, unforeseen incidence, bleeding, infection, nerve damage, failure to improve, worsening of symptoms, morbidity, and mortality risks were explained. All questions were answered. Patient was told of no guarantees. Patient accepts all risks and benefits. A consent for surgery will be documented and signed by the patient or a legal guardian. All questions were answered. The procedure was explained in detail.     The patient was counseled about the risks of contracting Covid-19 during their perioperative period and any recovery window from their procedure. The patient was made aware that contracting Covid-19 may worsen their prognosis for recovering from their procedure and lend to a higher morbidity and/or mortality risk. All material risks, benefits, and reasonable alternatives including postponing the procedure were discussed. The patient DOES wish to proceed with their procedure at this time.     Encounter Diagnoses     ICD-10-CM ICD-9-CM   1. Primary osteoarthritis of right knee  M17.11 715.16       HPI:   The patient is here with a chief complaint of right knee pain, dull throbbing pain, progressively getting worse.  Pain is 3/10.  Failed conservative treatment.    ROS:  10-point review of systems is positive for nighttime pain.    X-rays of the right knee are positive for severe OA.    Assessment/Plan:  Plan would be for right total knee replacement with Percocet, Keflex, and Eliquis for postop DVT prophylaxis.  He is cleared from his cardiologist.        Return to Office:   Follow-up and Dispositions    ?? Return for already sch for surgery.           Scribed by Derry Skill, MD as dictated by [name]. Posey Pronto, MD.  Documentation True and Accepted Antonio Isola A. Posey Pronto, MD

## 2019-09-29 NOTE — Patient Instructions (Signed)
Knee Arthritis: Care Instructions  Your Care Instructions     Knee arthritis is a breakdown of the cartilage that cushions your knee joint. When the cartilage wears down, your bones rub against each other. This causes pain and stiffness. Knee arthritis tends to get worse with time.  Treatment for knee arthritis involves reducing pain, making the leg muscles stronger, and staying at a healthy body weight. The treatment usually does not improve the health of the cartilage, but it can reduce pain and improve how well your knee works.  You can take simple measures to protect your knee joints, ease your pain, and help you stay active.  Follow-up care is a key part of your treatment and safety. Be sure to make and go to all appointments, and call your doctor if you are having problems. It's also a good idea to know your test results and keep a list of the medicines you take.  How can you care for yourself at home?  ?? Know that knee arthritis will cause more pain on some days than on others.  ?? Stay at a healthy weight. Lose weight if you are overweight. When you stand up, the pressure on your knees from every pound of body weight is multiplied four times. So if you lose 10 pounds, you will reduce the pressure on your knees by 40 pounds.  ?? Talk to your doctor or physical therapist about exercises that will help ease joint pain.  ? Stretch to help prevent stiffness and to prevent injury before you exercise. You may enjoy gentle forms of yoga to help keep your knee joints and muscles flexible.  ? Walk instead of jog.  ? Ride a bike. This makes your thigh muscles stronger and takes pressure off your knee.  ? Wear well-fitting and comfortable shoes.  ? Exercise in chest-deep water. This can help you exercise longer with less pain.  ? Avoid exercises that include squatting or kneeling. They can put a lot of strain on your knees.   ? Talk to your doctor to make sure that the exercise you do is not making the arthritis worse.  ?? Do not sit for long periods of time. Try to walk once in a while to keep your knee from getting stiff.  ?? Ask your doctor or physical therapist whether shoe inserts may reduce your arthritis pain.  ?? If you can afford it, get new athletic shoes at least every year. This can help reduce the strain on your knees.  ?? Use a device to help you do everyday activities.  ? A cane or walking stick can help you keep your balance when you walk. Hold the cane or walking stick in the hand opposite the painful knee.  ? If you feel like you may fall when you walk, try using crutches or a front-wheeled walker. These can prevent falls that could cause more damage to your knee.  ? A knee brace may help keep your knee stable and prevent pain.  ? You also can use other things to make life easier, such as a higher toilet seat and handrails in the bathtub or shower.  ?? Take pain medicines exactly as directed.  ? Do not wait until you are in severe pain. You will get better results if you take it sooner.  ? If you are not taking a prescription pain medicine, take an over-the-counter medicine such as acetaminophen (Tylenol), ibuprofen (Advil, Motrin), or naproxen (Aleve). Read and follow all instructions on   the label.  ? Do not take two or more pain medicines at the same time unless the doctor told you to. Many pain medicines have acetaminophen, which is Tylenol. Too much acetaminophen (Tylenol) can be harmful.  ? Tell your doctor if you take a blood thinner, have diabetes, or have allergies to shellfish.  ?? Ask your doctor if you might benefit from a shot of steroid medicine into your knee. This may provide pain relief for several months.   ?? Many people take the supplements glucosamine and chondroitin for osteoarthritis. Some people feel they help, but the medical research does not show that they work. Talk to your doctor before you take these supplements.  When should you call for help?   Call your doctor now or seek immediate medical care if:  ?? ?? You have sudden swelling, warmth, or pain in your knee.   ?? ?? You have knee pain and a fever or rash.   ?? ?? You have such bad pain that you cannot use your knee.   Watch closely for changes in your health, and be sure to contact your doctor if you have any problems.  Where can you learn more?  Go to https://www.healthwise.net/GoodHelpConnections  Enter W187 in the search box to learn more about "Knee Arthritis: Care Instructions."  Current as of: July 26, 2018??????????????????????????????Content Version: 12.6  ?? 2006-2020 Healthwise, Incorporated.   Care instructions adapted under license by [employer] (which disclaims liability or warranty for this information). If you have questions about a medical condition or this instruction, always ask your healthcare professional. Healthwise, Incorporated disclaims any warranty or liability for your use of this information.

## 2019-09-29 NOTE — Progress Notes (Signed)
Name: Antonio Perez    DOB: 08-Aug-1951     Service Dept: [facility]    Patient's Pharmacies:    [place], [address]  [address]  [place] [address]  Phone: [phone] Fax: [phone]       Chief Complaint   Patient presents with   ??? Pre-op Exam     Knee        There were no vitals taken for this visit.   Allergies   Allergen Reactions   ??? Ambien [Zolpidem] Other (comments)     Hyperactivity    ??? Other Medication Other (comments)     Seasonal allergies      Current Outpatient Medications   Medication Sig Dispense Refill   ??? oxyCODONE-acetaminophen (Percocet) 5-325 mg per tablet Take 1 Tab by mouth every four to six (4-6) hours as needed for Pain for up to 14 days. Max Daily Amount: 6 Tabs. This Rx is to only be used after surgical procedure 30 Tab 0   ??? cephALEXin (Keflex) 500 mg capsule Take 1 Cap by mouth every eight (8) hours for 3 doses. Start antibiotics after surgery 3 Cap 0   ??? ALPRAZolam (XANAX) 0.5 mg tablet      ??? cetirizine (ZYRTEC) 10 mg tablet Take 10 mg by mouth daily.     ??? apixaban (ELIQUIS) 5 mg tablet Take 5 mg by mouth two (2) times a day.     ??? carvedilol (COREG) 25 mg tablet Take 25 mg by mouth two (2) times daily (with meals).        There is no problem list on file for this patient.     Family History   Problem Relation Age of Onset   ??? Thyroid Disease Brother       Social History     Socioeconomic History   ??? Marital status: MARRIED     Spouse name: Not on file   ??? Number of children: Not on file   ??? Years of education: Not on file   ??? Highest education level: Not on file   Tobacco Use   ??? Smoking status: Never Smoker   ??? Smokeless tobacco: Never Used   Substance and Sexual Activity   ??? Alcohol use: Yes     Comment: occ   ??? Drug use: No   ??? Sexual activity: Not Currently      Past Surgical History:   Procedure Laterality Date   ??? COLONOSCOPY N/A 12/01/2017    COLONOSCOPY, DIAGNOSTIC  with cold snare polypectomy performed by Barkley Bruns, MD at [facility] ENDOSCOPY   ??? HX LITHOTRIPSY     ??? HX PROSTATECTOMY  2000    [place], [place]   ??? HX UROLOGICAL      stent insertion   ??? HX UROLOGICAL      stent removal      Past Medical History:   Diagnosis Date   ??? Atrial fibrillation ([place]) 2011   ??? Essential hypertension    ??? Heart disease    ??? High cholesterol    ??? Kidney stone    ??? Prostate cancer ([place]) 2000        I have reviewed and agree with [place] and ROS and intake form in chart and the record furthermore I have reviewed prior medical record(s) regarding this patients care during this appointment.     Review  of Systems:   Patient is a pleasant appearing individual, appropriately dressed, well hydrated, well nourished, who is alert, appropriately oriented for age, and in no acute distress with a normal gait and normal affect who does not appear to be in any significant pain.     Physical Exam:  Right Knee -Decrease range of motion with flexion, Knee arc of greater than 50 degrees, Some crepitation, Grossly neurovascularly intact, Good cap refill, No skin lesion, Moderate swelling, No gross instability, Some quadriceps weakness, Kellgren and Lawrence at least grade 3    Left Knee - Full Range of Motion, No crepitation, Grossly neurovascularly intact, Good cap refill, No skin lesion, No swelling, No gross instability, No quadriceps weakness    Inpatient status: The patient has admitted to severe pain in the affected knee and due to such pain they are unable to complete activities of daily living at home and/or work on a regular basis where conservative treatments have failed. After extensive discussion with the patient, they have chosen to receive a total knee replacement with the expectation of inpatient procedure. Their dependent functional status (i.e. lack of capable support and safety at home, pain management, comorbities, or difficulty ambulating with assistive walking devices) would deem them a  candidate for an inpatient stay. The patient acknowledges and understand the plan.    The risks of surgery were explained to the patient which include but not limited to infection, nerve injury, artery injury, tendon injury, poor result, poor wound healing, unforeseen incidence, bleeding, infection, nerve damage, failure to improve, worsening of symptoms, morbidity, and mortality risks were explained. All questions were answered. Patient was told of no guarantees. Patient accepts all risks and benefits. A consent for surgery will be documented and signed by the patient or a legal guardian. All questions were answered. The procedure was explained in detail.     The patient was counseled about the risks of contracting Covid-19 during their perioperative period and any recovery window from their procedure. The patient was made aware that contracting Covid-19 may worsen their prognosis for recovering from their procedure and lend to a higher morbidity and/or mortality risk. All material risks, benefits, and reasonable alternatives including postponing the procedure were discussed. The patient DOES wish to proceed with their procedure at this time.     Encounter Diagnoses     ICD-10-CM ICD-9-CM   1. Primary osteoarthritis of right knee  M17.11 715.16       HPI:  The patient is here with a chief complaint of right knee pain, dull throbbing pain, progressively getting worse.  Pain is 3/10.  Failed conservative treatment.    ROS:  10-point review of systems is positive for nighttime pain.    X-rays of the right knee are positive for severe OA.    Assessment/Plan:  Plan would be for right total knee replacement with Percocet, Keflex, and Eliquis for postop DVT prophylaxis.  He is cleared from his cardiologist.        Return to Office:   Follow-up and Dispositions    ?? Return for already sch for surgery.           Scribed by Derry Skill, MD as dictated by [name]. Posey Pronto, MD.  Documentation True and Accepted Kary Sugrue A.  Posey Pronto, MD

## 2019-09-30 LAB — NOVEL CORONAVIRUS (COVID-19): SARS-CoV-2: NOT DETECTED

## 2019-09-30 LAB — COVID-19: SARS-CoV-2: NOT DETECTED

## 2019-10-03 MED FILL — SODIUM CHLORIDE 0.9 % IJ SYRG: INTRAMUSCULAR | Qty: 40

## 2019-10-03 MED FILL — LACTATED RINGERS IV: INTRAVENOUS | Qty: 1000

## 2019-10-05 MED FILL — CEFAZOLIN 1 GRAM SOLUTION FOR INJECTION: 1 gram | INTRAMUSCULAR | Qty: 2000

## 2019-10-06 ENCOUNTER — Encounter: Attending: Orthopaedic Surgery | Primary: Cardiovascular Disease

## 2019-10-06 ENCOUNTER — Inpatient Hospital Stay: Payer: MEDICARE

## 2019-10-06 ENCOUNTER — Observation Stay: Admit: 2019-10-06 | Payer: MEDICARE | Primary: Cardiovascular Disease

## 2019-10-06 LAB — TYPE & SCREEN
ABO/Rh(D): B POS
Antibody screen: NEGATIVE

## 2019-10-06 LAB — TYPE AND SCREEN
ABO/Rh: B POS
Antibody Screen: NEGATIVE

## 2019-10-06 MED ORDER — SENNOSIDES 8.6 MG TAB
8.6 mg | Freq: Two times a day (BID) | ORAL | Status: DC
Start: 2019-10-06 — End: 2019-10-06

## 2019-10-06 MED ORDER — BISACODYL 10 MG RECTAL SUPPOSITORY
10 mg | Freq: Every day | RECTAL | Status: DC | PRN
Start: 2019-10-06 — End: 2019-10-06

## 2019-10-06 MED ORDER — KETAMINE 10 MG/ML IJ SOLN
10 mg/mL | INTRAMUSCULAR | Status: DC | PRN
Start: 2019-10-06 — End: 2019-10-06
  Administered 2019-10-06 (×3): via INTRAVENOUS

## 2019-10-06 MED ORDER — LIDOCAINE HCL 2 % (20 MG/ML) IJ SOLN
20 mg/mL (2 %) | INTRAMUSCULAR | Status: AC
Start: 2019-10-06 — End: ?

## 2019-10-06 MED ORDER — KETOROLAC TROMETHAMINE 30 MG/ML INJECTION
30 mg/mL (1 mL) | Freq: Four times a day (QID) | INTRAMUSCULAR | Status: DC | PRN
Start: 2019-10-06 — End: 2019-10-06

## 2019-10-06 MED ORDER — NALOXONE 0.4 MG/ML INJECTION
0.4 mg/mL | INTRAMUSCULAR | Status: DC | PRN
Start: 2019-10-06 — End: 2019-10-06

## 2019-10-06 MED ORDER — FENTANYL CITRATE (PF) 50 MCG/ML IJ SOLN
50 mcg/mL | INTRAMUSCULAR | Status: DC | PRN
Start: 2019-10-06 — End: 2019-10-06

## 2019-10-06 MED ORDER — MIDAZOLAM 1 MG/ML IJ SOLN
1 mg/mL | INTRAMUSCULAR | Status: AC
Start: 2019-10-06 — End: ?

## 2019-10-06 MED ORDER — OXYCODONE-ACETAMINOPHEN 10 MG-325 MG TAB
10-325 mg | ORAL | Status: DC | PRN
Start: 2019-10-06 — End: 2019-10-06

## 2019-10-06 MED ORDER — KETOROLAC TROMETHAMINE 30 MG/ML INJECTION
30 mg/mL (1 mL) | INTRAMUSCULAR | Status: DC | PRN
Start: 2019-10-06 — End: 2019-10-06
  Administered 2019-10-06: 15:00:00 via INTRAVENOUS

## 2019-10-06 MED ORDER — LACTATED RINGERS IV
INTRAVENOUS | Status: DC
Start: 2019-10-06 — End: 2019-10-06

## 2019-10-06 MED ORDER — GLUCAGON 1 MG INJECTION
1 mg | INTRAMUSCULAR | Status: DC | PRN
Start: 2019-10-06 — End: 2019-10-06

## 2019-10-06 MED ORDER — MIDAZOLAM 1 MG/ML IJ SOLN
1 mg/mL | INTRAMUSCULAR | Status: DC | PRN
Start: 2019-10-06 — End: 2019-10-06
  Administered 2019-10-06 (×2): via INTRAVENOUS

## 2019-10-06 MED ORDER — SODIUM CHLORIDE 0.9 % IRRIGATION SOLN
0.9 % | Status: DC | PRN
Start: 2019-10-06 — End: 2019-10-06
  Administered 2019-10-06: 13:00:00

## 2019-10-06 MED ORDER — SODIUM CHLORIDE 0.9 % IJ SYRG
Freq: Three times a day (TID) | INTRAMUSCULAR | Status: DC
Start: 2019-10-06 — End: 2019-10-06

## 2019-10-06 MED ORDER — FLUMAZENIL 0.1 MG/ML IV SOLN
0.1 mg/mL | INTRAVENOUS | Status: DC | PRN
Start: 2019-10-06 — End: 2019-10-06

## 2019-10-06 MED ORDER — EPHEDRINE (PF) 50 MG/5 ML (10 MG/ML) IN NS IV SYRINGE
50 mg/5 mL (10 mg/mL) | INTRAVENOUS | Status: DC | PRN
Start: 2019-10-06 — End: 2019-10-06
  Administered 2019-10-06: 14:00:00 via INTRAVENOUS

## 2019-10-06 MED ORDER — ACETAMINOPHEN 500 MG TAB
500 mg | Freq: Once | ORAL | Status: AC
Start: 2019-10-06 — End: 2019-10-06
  Administered 2019-10-06: 12:00:00 via ORAL

## 2019-10-06 MED ORDER — SODIUM CHLORIDE 0.9 % IJ SYRG
INTRAMUSCULAR | Status: DC | PRN
Start: 2019-10-06 — End: 2019-10-06

## 2019-10-06 MED ORDER — POLYMYXIN B SULFATE 500,000 UNIT IJ SOLR
500000 unit | INTRAMUSCULAR | Status: AC
Start: 2019-10-06 — End: ?

## 2019-10-06 MED ORDER — DIPHENHYDRAMINE HCL 50 MG/ML IJ SOLN
50 mg/mL | INTRAMUSCULAR | Status: DC | PRN
Start: 2019-10-06 — End: 2019-10-06

## 2019-10-06 MED ORDER — PROPOFOL 10 MG/ML IV EMUL
10 mg/mL | INTRAVENOUS | Status: AC
Start: 2019-10-06 — End: ?

## 2019-10-06 MED ORDER — EPHEDRINE (PF) 50 MG/5 ML (10 MG/ML) IN NS IV SYRINGE
50 mg/5 mL (10 mg/mL) | INTRAVENOUS | Status: AC
Start: 2019-10-06 — End: ?

## 2019-10-06 MED ORDER — LACTATED RINGERS IV
INTRAVENOUS | Status: DC
Start: 2019-10-06 — End: 2019-10-06
  Administered 2019-10-06: 12:00:00 via INTRAVENOUS

## 2019-10-06 MED ORDER — KETAMINE 50 MG/5 ML (10 MG/ML) IN NS IV SYRINGE
50 mg/5 mL (10 mg/mL) | INTRAVENOUS | Status: AC
Start: 2019-10-06 — End: ?

## 2019-10-06 MED ORDER — KETOROLAC TROMETHAMINE 30 MG/ML INJECTION
30 mg/mL (1 mL) | INTRAMUSCULAR | Status: AC
Start: 2019-10-06 — End: ?

## 2019-10-06 MED ORDER — DEXAMETHASONE SODIUM PHOSPHATE 4 MG/ML IJ SOLN
4 mg/mL | INTRAMUSCULAR | Status: AC
Start: 2019-10-06 — End: 2019-10-06
  Administered 2019-10-06: 13:00:00 via PERINEURAL

## 2019-10-06 MED ORDER — BUPIVACAINE (PF) 0.25 % (2.5 MG/ML) IJ SOLN
0.25 % (2.5 mg/mL) | INTRAMUSCULAR | Status: AC
Start: 2019-10-06 — End: 2019-10-06
  Administered 2019-10-06: 13:00:00 via PERINEURAL

## 2019-10-06 MED ORDER — TRANEXAMIC ACID 1,000 MG/10 ML (100 MG/ML) IV
1000 mg/10 mL (100 mg/mL) | INTRAVENOUS | Status: AC
Start: 2019-10-06 — End: ?

## 2019-10-06 MED ORDER — PROPOFOL 10 MG/ML IV EMUL
10 mg/mL | INTRAVENOUS | Status: DC | PRN
Start: 2019-10-06 — End: 2019-10-06
  Administered 2019-10-06: 15:00:00 via INTRAVENOUS

## 2019-10-06 MED ORDER — OXYCODONE-ACETAMINOPHEN 5 MG-325 MG TAB
5-325 mg | ORAL | Status: DC | PRN
Start: 2019-10-06 — End: 2019-10-06

## 2019-10-06 MED ORDER — GLUCOSE 4 GRAM CHEWABLE TAB
4 gram | ORAL | Status: DC | PRN
Start: 2019-10-06 — End: 2019-10-06

## 2019-10-06 MED ORDER — ONDANSETRON (PF) 4 MG/2 ML INJECTION
4 mg/2 mL | INTRAMUSCULAR | Status: DC | PRN
Start: 2019-10-06 — End: 2019-10-06

## 2019-10-06 MED ORDER — CEFAZOLIN 1 GRAM SOLUTION FOR INJECTION
1 gram | Freq: Once | INTRAMUSCULAR | Status: AC
Start: 2019-10-06 — End: 2019-10-06
  Administered 2019-10-06: 13:00:00 via INTRAVENOUS

## 2019-10-06 MED ORDER — SODIUM CHLORIDE 0.9 % IV
10 gram | Freq: Three times a day (TID) | INTRAVENOUS | Status: DC
Start: 2019-10-06 — End: 2019-10-06

## 2019-10-06 MED ORDER — DEXTROSE 50% IN WATER (D50W) IV SYRG
INTRAVENOUS | Status: DC | PRN
Start: 2019-10-06 — End: 2019-10-06

## 2019-10-06 MED ORDER — ONDANSETRON (PF) 4 MG/2 ML INJECTION
4 mg/2 mL | Freq: Once | INTRAMUSCULAR | Status: DC
Start: 2019-10-06 — End: 2019-10-06

## 2019-10-06 MED ORDER — TRANEXAMIC ACID 1,000 MG/10 ML (100 MG/ML) IV
1000 mg/10 mL (100 mg/mL) | INTRAVENOUS | Status: DC | PRN
Start: 2019-10-06 — End: 2019-10-06
  Administered 2019-10-06 (×2): via INTRAVENOUS

## 2019-10-06 MED ORDER — BUPIVACAINE (PF) 0.25 % (2.5 MG/ML) IJ SOLN
0.25 % (2.5 mg/mL) | INTRAMUSCULAR | Status: AC
Start: 2019-10-06 — End: ?

## 2019-10-06 MED ORDER — GABAPENTIN 300 MG CAP
300 mg | Freq: Once | ORAL | Status: AC
Start: 2019-10-06 — End: 2019-10-06
  Administered 2019-10-06: 12:00:00 via ORAL

## 2019-10-06 MED ORDER — ALBUTEROL SULFATE 0.083 % (0.83 MG/ML) SOLN FOR INHALATION
2.5 mg /3 mL (0.083 %) | Freq: Once | RESPIRATORY_TRACT | Status: DC | PRN
Start: 2019-10-06 — End: 2019-10-06

## 2019-10-06 MED ORDER — FENTANYL CITRATE (PF) 50 MCG/ML IJ SOLN
50 mcg/mL | INTRAMUSCULAR | Status: DC | PRN
Start: 2019-10-06 — End: 2019-10-06
  Administered 2019-10-06: 13:00:00 via INTRAVENOUS

## 2019-10-06 MED ORDER — BUPIVACAINE 0.25 % (2.5 MG/ML) IJ SOLN
0.25 % (2.5 mg/mL) | INTRAMUSCULAR | Status: DC | PRN
Start: 2019-10-06 — End: 2019-10-06
  Administered 2019-10-06: 13:00:00 via SUBCUTANEOUS

## 2019-10-06 MED ORDER — ASPIRIN 325 MG TAB, DELAYED RELEASE
325 mg | Freq: Two times a day (BID) | ORAL | Status: DC
Start: 2019-10-06 — End: 2019-10-06

## 2019-10-06 MED ORDER — FENTANYL CITRATE (PF) 50 MCG/ML IJ SOLN
50 mcg/mL | INTRAMUSCULAR | Status: AC
Start: 2019-10-06 — End: ?

## 2019-10-06 MED ORDER — INSULIN LISPRO 100 UNIT/ML INJECTION
100 unit/mL | Freq: Once | SUBCUTANEOUS | Status: DC
Start: 2019-10-06 — End: 2019-10-06

## 2019-10-06 MED ORDER — ACETAMINOPHEN 325 MG TABLET
325 mg | ORAL | Status: DC | PRN
Start: 2019-10-06 — End: 2019-10-06

## 2019-10-06 MED FILL — KETAMINE 50 MG/5 ML (10 MG/ML) IN NS IV SYRINGE: 50 mg/5 mL (10 mg/mL) | INTRAVENOUS | Qty: 5

## 2019-10-06 MED FILL — LACTATED RINGERS IV: INTRAVENOUS | Qty: 1000

## 2019-10-06 MED FILL — BUPIVACAINE (PF) 0.25 % (2.5 MG/ML) IJ SOLN: 0.25 % (2.5 mg/mL) | INTRAMUSCULAR | Qty: 30

## 2019-10-06 MED FILL — XYLOCAINE 20 MG/ML (2 %) INJECTION SOLUTION: 20 mg/mL (2 %) | INTRAMUSCULAR | Qty: 20

## 2019-10-06 MED FILL — TYLENOL EXTRA STRENGTH 500 MG TABLET: 500 mg | ORAL | Qty: 2

## 2019-10-06 MED FILL — SODIUM CHLORIDE 0.9 % IJ SYRG: INTRAMUSCULAR | Qty: 40

## 2019-10-06 MED FILL — KETOROLAC TROMETHAMINE 30 MG/ML INJECTION: 30 mg/mL (1 mL) | INTRAMUSCULAR | Qty: 1

## 2019-10-06 MED FILL — TRANEXAMIC ACID 1,000 MG/10 ML (100 MG/ML) IV: 1000 mg/10 mL (100 mg/mL) | INTRAVENOUS | Qty: 10

## 2019-10-06 MED FILL — POLYMYXIN B SULFATE 500,000 UNIT IJ SOLR: 500000 unit | INTRAMUSCULAR | Qty: 500000

## 2019-10-06 MED FILL — ASPIRIN 325 MG TAB, DELAYED RELEASE: 325 mg | ORAL | Qty: 1

## 2019-10-06 MED FILL — MIDAZOLAM 1 MG/ML IJ SOLN: 1 mg/mL | INTRAMUSCULAR | Qty: 2

## 2019-10-06 MED FILL — GABAPENTIN 300 MG CAP: 300 mg | ORAL | Qty: 1

## 2019-10-06 MED FILL — EPHEDRINE 50 MG/5 ML (10 MG/ML) IN NS IV SYRINGE: 50 mg/5 mL (10 mg/mL) | INTRAVENOUS | Qty: 5

## 2019-10-06 MED FILL — FENTANYL CITRATE (PF) 50 MCG/ML IJ SOLN: 50 mcg/mL | INTRAMUSCULAR | Qty: 2

## 2019-10-06 MED FILL — DIPRIVAN 10 MG/ML INTRAVENOUS EMULSION: 10 mg/mL | INTRAVENOUS | Qty: 20

## 2019-10-06 NOTE — Anesthesia Pre-Procedure Evaluation (Signed)
Relevant Problems   No relevant active problems       Anesthetic History   No history of anesthetic complications            Review of Systems / Medical History  Patient summary reviewed, nursing notes reviewed and pertinent labs reviewed    Pulmonary  Within defined limits                 Neuro/Psych   Within defined limits           Cardiovascular  Within defined limits  Hypertension        Dysrhythmias       Exercise tolerance: >4 METS     GI/Hepatic/Renal  Within defined limits              Endo/Other        Arthritis     Other Findings              Physical Exam    Airway  Mallampati: II  TM Distance: 4 - 6 cm  Neck ROM: normal range of motion   Mouth opening: Normal     Cardiovascular    Rhythm: irregular  Rate: normal         Dental  No notable dental hx       Pulmonary  Breath sounds clear to auscultation               Abdominal  GI exam deferred       Other Findings            Anesthetic Plan    ASA: 3  Anesthesia type: spinal      Post-op pain plan if not by surgeon: peripheral nerve block single    Induction: Intravenous  Anesthetic plan and risks discussed with: Patient

## 2019-10-06 NOTE — Anesthesia Post-Procedure Evaluation (Signed)
Procedure(s):  RIGHT TKA (STANDARD) (OUTPATIENT).    spinal    Anesthesia Post Evaluation      Multimodal analgesia: multimodal analgesia used between 6 hours prior to anesthesia start to PACU discharge  Patient location during evaluation: PACU  Patient participation: complete - patient participated  Level of consciousness: awake and alert  Pain management: adequate  Airway patency: patent  Anesthetic complications: no  Cardiovascular status: acceptable and stable  Respiratory status: acceptable, spontaneous ventilation and room air  Hydration status: acceptable  Comments: Ok to discharge when post op criteria met  Post anesthesia nausea and vomiting:  none  Final Post Anesthesia Temperature Assessment:  Normothermia (36.0-37.5 degrees C)      INITIAL Post-op Vital signs:   Vitals Value Taken Time   BP 118/73 10/06/19 0947   Temp 36.1 ??C (97 ??F) 10/06/19 0947   Pulse 81 10/06/19 0947   Resp 12 10/06/19 0947   SpO2 95 % 10/06/19 0947

## 2019-10-06 NOTE — Other (Deleted)
Patient unable to bear weight on legs. Physical Therapist states he will attempt consult again after the patient has more feeling in her legs. Patient returned to stretcher with polar care machine in place to left knee. Pt. Eating crackers.

## 2019-10-06 NOTE — Op Note (Signed)
Operative Note    Patient: Antonio Perez MRN: [id number]  Surgery Date: 10/06/2019  @ORCASETRK2 @          Procedure  Primary Surgeon    RIGHT TKA (STANDARD) (OUTPATIENT)  Derry Skill, MD    * Panel 2 does not exist *  * Panel 2 does not exist *    * Panel 3 does not exist *  * Panel 3 does not exist *     Surgeon(s) and Role:     * Derry Skill, MD - Primary    Other OR Staff/Assistants:  Circ-1: Cher Nakai Renee  Scrub Tech-1: Garret Reddish  Surg Asst-1: Bebe Liter    1st Assistant Tasks:  Closing    Pre-operative Diagnosis:  Primary osteoarthritis of right knee [M17.11]    Post-operative Diagnosis: same as preop diagnosis    Anesthesia Type: Spinal     Findings: djd    Complications: No    EBL: 50 cc    Specimens: None    Implants     Implant    Cement Bne 40gm Full Dose Pmma W/ Gent Hi Visc Radpq Lng - [id number] - Implanted   (Right) Knee    Inventory item: CEMENT BNE 40GM FULL DOSE PMMA W/ GENT HI VISC RADPQ LNG Model/Cat number: [id number]    Manufacturer: [employer] Lot number: [id number]    Size: 40g      As of 10/06/2019     Status: Implanted                  Pat Cem Dome Medial 66mm -- Attune [id number] - Implanted   (Right) Knee    Inventory item: PAT CEM DOME MEDIAL 38MM -- ATTUNE Model/Cat number: [id number]    Manufacturer: [employer] Lot number: [id number]    Size: 58mm      As of 10/06/2019     Status: Implanted                  Insert Tib Sz 8 Thk82mm Knee Post Stbl Rot Platfrm Attune - [id number] - Implanted   (Right) Knee    Inventory item: INSERT TIB SZ 8 THK6MM KNEE POST STBL ROT PLATFRM ATTUNE Model/Cat number: [id number]    Manufacturer: [employer] Lot number: [id number]    Size: 8 32mm      As of 10/06/2019     Status: Implanted                  Component Fem Sz 8 R Knee Post Stbl Cem Attune - [id number] - Implanted   (Right) Knee     Inventory item: COMPONENT FEM SZ 8 R KNEE POST STBL CEM ATTUNE Model/Cat number: [id number]    Manufacturer: [employer] Lot number: [id number]    Size: 8 right      As of 10/06/2019     Status: Implanted                  Baseplate Tib Sz 8 Cem Rot Platfrm Knee Sys S + Attune [id number] - Implanted   (Right) Knee    Inventory item: BASEPLATE TIB SZ 8 CEM ROT PLATFRM KNEE SYS S + ATTUNE Model/Cat number: [id number]    Manufacturer: [employer] Lot [id number]    Size: 8      As of 10/06/2019     Status: Implanted  OPERATIVE PROCEDURE:  Please note the first assistant role was to help in patient positioning and draping of the extremity in a sterile fashion. Also during the surgery the assistant's responsibilities included but not limited to extremity positioning during critical portions of the surgery.  Assisting in using and placement of retractors during surgery.     Lower extremity was prepped and draped in a sterile fashion.  After adequate anesthesia was given, the patient was placed in a well-padded supine position.  Subvastus arthrotomy from the tibial tubercle to the superior pole of the patella was made.  Knee was hyperflexed.  Intramedullary reaming of distal femur and proximal tibia was performed.  10 mm of distal femur was cut.  Anterior-posterior sizing guide was used.  Anterior, posterior, chamfer cuts, and box cuts were made next.  Proximal tibial cut and preparation performed.  Posterior osteophyte meniscal remnants were removed, and also patella was everted.  Free-hand cut of the patella was made.  Trial components were placed.  The patient was found to have excellent range of motion and stability with all trial components.  All the trial components removed.  Copious irrigation performed.  Distal femur, proximal tibia, and patella were impacted in place.  Excessive cement was removed.  After the cement was hard, Subvastus arthrotomy closed with Vicryl and Prineo stitch.  Compressive dressing was applied.  The patient was taken to PACU in stable condition.      Derry Skill, MD

## 2019-10-06 NOTE — Other (Signed)
Pt is up ambulating with physical therapy at this time.

## 2019-10-06 NOTE — Anesthesia Procedure Notes (Signed)
Spinal Block    Start time: 10/06/2019 7:55 AM  End time: 10/06/2019 8:00 AM  Performed by: Mar Daring, CRNA  Authorized by: Mar Daring, CRNA     Pre-procedure:  Indications: primary anesthetic  Preanesthetic Checklist: patient identified, risks and benefits discussed, anesthesia consent, site marked, patient being monitored and timeout performed    Timeout Time: 07:54          Spinal Block:   Patient Position:  Seated  Prep Region:  Lumbar  Prep: Betadine      Location:  L3-4  Technique:  Single shot        Needle:   Needle Type:  Pencil-tip  Needle Gauge:  25 G  Attempts:  1      Events: CSF confirmed, no blood with aspiration and no paresthesia        Assessment:  Insertion:  Uncomplicated  Patient tolerance:  Patient tolerated the procedure well with no immediate complications

## 2019-10-06 NOTE — Interval H&P Note (Signed)
Update History & Physical    The Patient's History and Physical was reviewed with the patient.  There was no change.  The surgical site was confirmed by the patient and me.    Plan:  The risk, benefits, expected outcome, and alternative to the recommended procedure have been discussed with the patient.  Patient understands and wants to proceed with the procedure.      Electronically signed by Derry Skill, MD on 10/06/2019 at 8:02 AM

## 2019-10-06 NOTE — Anesthesia Procedure Notes (Signed)
Peripheral Block    Start time: 10/06/2019 7:44 AM  End time: 10/06/2019 7:48 AM  Performed by: Mar Daring, CRNA  Authorized by: Mar Daring, CRNA       Pre-procedure:   Indications: at surgeon's request and post-op pain management    Preanesthetic Checklist: patient identified, risks and benefits discussed, site marked, timeout performed, anesthesia consent given and patient being monitored    Timeout Time: 07:44          Block Type:   Block Type:  Adductor canal  Laterality:  Right  Monitoring:  Standard ASA monitoring  Injection Technique:  Single shot  Procedures: ultrasound guided    Patient Position: supine  Prep: chlorhexidine    Needle Type:  Ultraplex  Needle Gauge:  21 G  Needle Localization:  Ultrasound guidance  Medication Injected:  Bupivacaine 0.25%, 20 mL  dexamethasone (DECADRON) 4 mg/mL injection, 4 mg  Med Admin Time: 10/06/2019 7:48 AM    Assessment:  Number of attempts:  1  Injection Assessment:  Incremental injection every 5 mL, local visualized surrounding nerve on ultrasound, negative aspiration for blood, no intravascular symptoms and no paresthesia  Patient tolerance:  Patient tolerated the procedure well with no immediate complications

## 2019-10-06 NOTE — Progress Notes (Addendum)
Problem: Mobility Impaired (Adult and Pediatric)  Goal: *Acute Goals and Plan of Care (Insert Text)  Description: Physical Therapy Goals  Pt has met all PT goals and is able to ambulate CGA using RW.  PLOF: Pt reports being Ind with all functional mobility.      Outcome: Resolved/Met     PHYSICAL THERAPY EVALUATION    Patient: Antonio Perez (69 y.o. male)  Date: 10/06/2019  Primary Diagnosis: Primary osteoarthritis of right knee [M17.11]  Knee osteoarthritis [M17.10]  Procedure(s) (LRB):  RIGHT TKA (STANDARD) (OUTPATIENT) (Right) Day of Surgery   Precautions:      WBAT RLE  PLOF: Pt reports being Ind with all functional mobility.    ASSESSMENT :  Based on the objective data described below, the patient presents with decreased strength, balance, and ROM after R TKA 10/06/2019. Pt jdemonstrates ability to safely ambulate using RW CGA on level surfaces. PT to recommend outpatient PT at this time.    Patient will benefit from skilled intervention to address the above impairments.  Patient's rehabilitation potential is considered to be Good  Factors which may influence rehabilitation potential include:   [x]          None noted  []          Mental ability/status  []          Medical condition  []          Home/family situation and support systems  []          Safety awareness  []          Pain tolerance/management  []          Other:      PLAN :  Recommendations and Planned Interventions:   [x]            Bed Mobility Training             []     Neuromuscular Re-Education  [x]            Transfer Training                   []     Orthotic/Prosthetic Training  [x]            Gait Training                          []     Modalities  [x]            Therapeutic Exercises           []     Edema Management/Control  [x]            Therapeutic Activities            []     Family Training/Education  [x]            Patient Education  []            Other (comment):     Frequency/Duration: Patient will be followed by physical therapy daily to address goals until discharge from hospital.  Discharge Recommendations: Outpatient  Further Equipment Recommendations for Discharge: N/A     SUBJECTIVE:   Patient stated ???I think I can walk.???    OBJECTIVE DATA SUMMARY:     Past Medical History:   Diagnosis Date   ??? Atrial fibrillation ([place]) 2011   ??? Essential hypertension    ??? Heart disease    ??? High cholesterol    ??? Kidney stone    ??? Prostate cancer ([place]) 2000  Past Surgical History:   Procedure Laterality Date   ??? COLONOSCOPY N/A 12/01/2017    COLONOSCOPY, DIAGNOSTIC with cold snare polypectomy performed by Barkley Bruns, [facility]   ??? HX LITHOTRIPSY     ??? HX PROSTATECTOMY  2000    [place], [place]   ??? HX UROLOGICAL      stent insertion   ??? HX UROLOGICAL      stent removal     Barriers to Learning/Limitations: None  Compensate with: N/A  Home Situation:  Home Situation  Home Environment: Private residence  # Steps to Enter: 61  Rails to Enter: Yes  Hand Rails : Left  One/Two Story Residence: Other (Comment)(3; Pt staying on 1st floor.)  Living Alone: No  Support Systems: Family member(s)  Patient Expects to be Discharged to:: Private residence  Current DME Used/Available at Home: None  Tub or Shower Type: Tourist information centre manager Behavior:                                Strength:    Strength: Generally decreased, functional   Gross RLE MMT: 4/5  Gross LLE MMT: WFL                 Tone & Sensation:                  Sensation: Intact               Range Of Motion:  AROM: Generally decreased, functional   R knee AROM: 0 - 122 deg        PROM: Generally decreased, functional   0- 128 deg     Gross LLE ROM: WFL     Posture:  Posture (WDL): Within defined limits     Functional Mobility:  Bed Mobility:  Rolling: Independent  Supine to Sit: Independent  Sit to Supine: Independent  Scooting: Independent  Transfers:  Sit to Stand: Stand-by assistance  Stand to Sit: Stand-by assistance   Stand Pivot Transfers: Stand-by assistance                    Balance:   Sitting: Intact  Standing: Impaired;With support  Standing - Static: Good  Standing - Dynamic : Fair  Wheelchair Mobility:        Ambulation/Gait Training:  Distance (ft): 1500 Feet (ft)  Assistive Device: Walker, rolling  Ambulation - Level of Assistance: Contact guard assistance     Gait Description (WDL): Exceptions to WDL  Gait Abnormalities: Antalgic;Circumduction;Trunk sway increased  Right Side Weight Bearing: As tolerated     Base of Support: Center of gravity altered;Widened     Speed/Cadence: Accelerated  Step Length: Left lengthened;Right lengthened  Swing Pattern: Left asymmetrical;Right asymmetrical           Pain:  Pain level pre-treatment: 0/10   Pain level post-treatment: 0/10   Pain Intervention(s) : Medication (see MAR); Rest, Ice, Repositioning  Response to intervention: Nurse notified, See doc flow    Activity Tolerance:     Please refer to the flowsheet for vital signs taken during this treatment.  After treatment:   []          Patient left in no apparent distress sitting up in chair  [x]          Patient left in no apparent distress in bed  [x]          Call bell left  within reach  [x]          Nursing notified  []          Caregiver present  []          Bed alarm activated  []          SCDs applied    COMMUNICATION/EDUCATION:   [x]          Role of Physical Therapy in the acute care setting.  [x]          Fall prevention education was provided and the patient/caregiver indicated understanding.  [x]          Patient/family have participated as able in goal setting and plan of care.  []          Patient/family agree to work toward stated goals and plan of care.  []          Patient understands intent and goals of therapy, but is neutral about his/her participation.  []          Patient is unable to participate in goal setting/plan of care: ongoing with therapy staff.  []          Other:    Thank you for this referral.   Arbie Cookey, PT ,DPT   Time Calculation: 36 mins      Eval Complexity: History: LOW Complexity : Zero comorbidities / personal factors that will impact the outcome / POCExam:LOW Complexity : 1-2 Standardized tests and measures addressing body structure, function, activity limitation and / or participation in recreation  Presentation: LOW Complexity : Stable, uncomplicated  Clinical Decision Making:Low Complexity    Overall Complexity:LOW

## 2019-10-06 NOTE — Other (Deleted)
Physical Therapy at bedside for consult.

## 2019-10-06 NOTE — Op Note (Signed)
Operative Note    Patient: Antonio Perez MRN: [id number]  Surgery Date: 10/06/2019  @ORCASETRK2 @          Procedure  Primary Surgeon    RIGHT TKA (STANDARD) (OUTPATIENT)  Derry Skill, MD    * Panel 2 does not exist *  * Panel 2 does not exist *    * Panel 3 does not exist *  * Panel 3 does not exist *     Surgeon(s) and Role:     * Derry Skill, MD - Primary    Other OR Staff/Assistants:  Circ-1: Cher Nakai Renee  Scrub Tech-1: Garret Reddish  Surg Asst-1: Bebe Liter    1st Assistant Tasks:  Closing    Pre-operative Diagnosis:  Primary osteoarthritis of right knee [M17.11]    Post-operative Diagnosis: same as preop diagnosis    Anesthesia Type: Spinal     Findings: djd    Complications: No    EBL: 50 cc    Specimens: None    Implants     Implant    Cement Bne 40gm Full Dose Pmma W/ Gent Hi Visc Radpq Lng - [id number] - Implanted   (Right) Knee    Inventory item: CEMENT BNE 40GM FULL DOSE PMMA W/ GENT HI VISC RADPQ LNG Model/Cat number: [id number]    Manufacturer: [employer] Lot number: [id number]    Size: 40g      As of 10/06/2019     Status: Implanted                  Pat Cem Dome Medial 81mm -- Attune [id number] - Implanted   (Right) Knee    Inventory item: PAT CEM DOME MEDIAL 38MM -- ATTUNE Model/Cat number: [id number]    Manufacturer: [employer] Lot number: [id number]    Size: 26mm      As of 10/06/2019     Status: Implanted                  Insert Tib Sz 8 Thk36mm Knee Post Stbl Rot Platfrm Attune - [id number] - Implanted   (Right) Knee    Inventory item: INSERT TIB SZ 8 THK6MM KNEE POST STBL ROT PLATFRM ATTUNE Model/Cat number: [id number]    Manufacturer: [employer] Lot number: [id number]    Size: 8 15mm      As of 10/06/2019     Status: Implanted                  Component Fem Sz 8 R Knee Post Stbl Cem Attune - [id number] - Implanted   (Right) Knee    Inventory item: COMPONENT FEM SZ 8 R KNEE POST STBL CEM ATTUNE Model/Cat  number: [id number]    Manufacturer: [employer] Lot number: [id number]    Size: 8 right      As of 10/06/2019     Status: Implanted                  Baseplate Tib Sz 8 Cem Rot Platfrm Knee Sys S + Attune [id number] - Implanted   (Right) Knee    Inventory item: BASEPLATE TIB SZ 8 CEM ROT PLATFRM KNEE SYS S + ATTUNE Model/Cat number: [id number]    Manufacturer: [employer] Lot [id number]    Size: 8      As of 10/06/2019     Status: Implanted  OPERATIVE PROCEDURE:  Please note the first assistant role was to help in patient positioning and draping of the extremity in a sterile fashion. Also during the surgery the assistant's responsibilities included but not limited to extremity positioning during critical portions of the surgery.  Assisting in using and placement of retractors during surgery.    Lower extremity was prepped and draped in a sterile fashion.  After adequate anesthesia was given, the patient was placed in a well-padded supine position.  Subvastus arthrotomy from the tibial tubercle to the superior pole of the patella was made.  Knee was hyperflexed.  Intramedullary reaming of distal femur and proximal tibia was performed.  10 mm of distal femur was cut.  Anterior-posterior sizing guide was used.  Anterior, posterior, chamfer cuts, and box cuts were made next.  Proximal tibial cut and preparation performed.  Posterior osteophyte meniscal remnants were removed, and also patella was everted.  Free-hand cut of the patella was made.  Trial components were placed.  The patient was found to have excellent range of motion and stability with all trial components.  All the trial components removed.  Copious irrigation performed.  Distal femur, proximal tibia, and patella were impacted in place.  Excessive cement was removed.  After the cement was hard, Subvastus arthrotomy closed with Vicryl and Prineo stitch.  Compressive dressing was applied.  The  patient was taken to PACU in stable condition.      Derry Skill, MD

## 2019-10-06 NOTE — Interval H&P Note (Signed)
Pt is up ambulating with physical therapy at this time.

## 2019-10-06 NOTE — Progress Notes (Signed)
Problem: Mobility Impaired (Adult and Pediatric)  Goal: *Acute Goals and Plan of Care (Insert Text)  Description: Physical Therapy Goals  Pt has met all PT goals and is able to ambulate CGA using RW.  PLOF: Pt reports being Ind with all functional mobility.      Outcome: Resolved/Met     PHYSICAL THERAPY EVALUATION    Patient: Antonio Perez (69 y.o. male)  Date: 10/06/2019  Primary Diagnosis: Primary osteoarthritis of right knee [M17.11]  Knee osteoarthritis [M17.10]  Procedure(s) (LRB):  RIGHT TKA (STANDARD) (OUTPATIENT) (Right) Day of Surgery   Precautions:      WBAT RLE  PLOF: Pt reports being Ind with all functional mobility.    ASSESSMENT :  Based on the objective data described below, the patient presents with decreased strength, balance, and ROM after R TKA 10/06/2019. Pt jdemonstrates ability to safely ambulate using RW CGA on level surfaces. PT to recommend outpatient PT at this time.    Patient will benefit from skilled intervention to address the above impairments.  Patient's rehabilitation potential is considered to be Good  Factors which may influence rehabilitation potential include:   [x]          None noted  []          Mental ability/status  []          Medical condition  []          Home/family situation and support systems  []          Safety awareness  []          Pain tolerance/management  []          Other:      PLAN :  Recommendations and Planned Interventions:   [x]            Bed Mobility Training             []     Neuromuscular Re-Education  [x]            Transfer Training                   []     Orthotic/Prosthetic Training  [x]            Gait Training                          []     Modalities  [x]            Therapeutic Exercises           []     Edema Management/Control  [x]            Therapeutic Activities            []     Family Training/Education  [x]            Patient Education  []            Other (comment):    Frequency/Duration: Patient will be followed by physical therapy daily to address  goals until discharge from hospital.  Discharge Recommendations: Outpatient  Further Equipment Recommendations for Discharge: N/A     SUBJECTIVE:   Patient stated "I think I can walk."    OBJECTIVE DATA SUMMARY:     Past Medical History:   Diagnosis Date   . Atrial fibrillation ([facility]) 2011   . Essential hypertension    . Heart disease    . High cholesterol    . Kidney stone    . Prostate cancer ([facility]) 2000  Past Surgical History:   Procedure Laterality Date   . COLONOSCOPY N/A 12/01/2017    COLONOSCOPY, DIAGNOSTIC with cold snare polypectomy performed by Antonio Anes, MD at [facility] ENDOSCOPY   . HX LITHOTRIPSY     . HX PROSTATECTOMY  2000    Perez, Antonio   . HX UROLOGICAL      stent insertion   . HX UROLOGICAL      stent removal     Barriers to Learning/Limitations: None  Compensate with: N/A  Home Situation:  Home Situation  Home Environment: Private residence  # Steps to Enter: 15  Rails to Enter: Yes  Hand Rails : Left  One/Two Story Residence: Other (Comment)(3; Pt staying on 1st floor.)  Living Alone: No  Support Systems: Family member(s)  Patient Expects to be Discharged to:: Private residence  Current DME Used/Available at Home: None  Tub or Shower Type: Arts development officer Behavior:                                Strength:    Strength: Generally decreased, functional   Gross RLE MMT: 4/5  Gross LLE MMT: WFL                 Tone & Sensation:                  Sensation: Intact               Range Of Motion:  AROM: Generally decreased, functional   R knee AROM: 0 - 122 deg        PROM: Generally decreased, functional   0- 128 deg     Gross LLE ROM: WFL     Posture:  Posture (WDL): Within defined limits     Functional Mobility:  Bed Mobility:  Rolling: Independent  Supine to Sit: Independent  Sit to Supine: Independent  Scooting: Independent  Transfers:  Sit to Stand: Stand-by assistance  Stand to Sit: Stand-by assistance  Stand Pivot Transfers: Stand-by assistance                    Balance:   Sitting:  Intact  Standing: Impaired;With support  Standing - Static: Good  Standing - Dynamic : Fair  Wheelchair Mobility:        Ambulation/Gait Training:  Distance (ft): 1500 Feet (ft)  Assistive Device: Walker, rolling  Ambulation - Level of Assistance: Contact guard assistance     Gait Description (WDL): Exceptions to WDL  Gait Abnormalities: Antalgic;Circumduction;Trunk sway increased  Right Side Weight Bearing: As tolerated     Base of Support: Center of gravity altered;Widened     Speed/Cadence: Accelerated  Step Length: Left lengthened;Right lengthened  Swing Pattern: Left asymmetrical;Right asymmetrical           Pain:  Pain level pre-treatment: 0/10   Pain level post-treatment: 0/10   Pain Intervention(s) : Medication (see MAR); Rest, Ice, Repositioning  Response to intervention: Nurse notified, See doc flow    Activity Tolerance:     Please refer to the flowsheet for vital signs taken during this treatment.  After treatment:   []          Patient left in no apparent distress sitting up in chair  [x]          Patient left in no apparent distress in bed  [x]          Call bell left  within reach  [x]          Nursing notified  []          Caregiver present  []          Bed alarm activated  []          SCDs applied    COMMUNICATION/EDUCATION:   [x]          Role of Physical Therapy in the acute care setting.  [x]          Fall prevention education was provided and the patient/caregiver indicated understanding.  [x]          Patient/family have participated as able in goal setting and plan of care.  []          Patient/family agree to work toward stated goals and plan of care.  []          Patient understands intent and goals of therapy, but is neutral about his/her participation.  []          Patient is unable to participate in goal setting/plan of care: ongoing with therapy staff.  []          Other:    Thank you for this referral.  Sydnee Levans, PT ,DPT   Time Calculation: 36 mins      Eval Complexity: History: LOW  Complexity : Zero comorbidities / personal factors that will impact the outcome / POCExam:LOW Complexity : 1-2 Standardized tests and measures addressing body structure, function, activity limitation and / or participation in recreation  Presentation: LOW Complexity : Stable, uncomplicated  Clinical Decision Making:Low Complexity    Overall Complexity:LOW

## 2019-10-07 ENCOUNTER — Encounter

## 2019-10-12 NOTE — Telephone Encounter (Signed)
PATIENT HAD SURGERY LAST Thursday FOR RT TKA, HE SAID HES HAS ONLY BEEN TAKING TYLENOL BUT HE IS WAKING UP EVERYNIGHT IN A SWEAT, HE DOESN'T HAVE A FEVER BUT HE IS WONDERING IF THIS IS NORMAL?

## 2019-10-12 NOTE — Telephone Encounter (Signed)
lvm asking patient to call back

## 2019-10-13 ENCOUNTER — Telehealth
Admit: 2019-10-13 | Discharge: 2019-10-13 | Payer: MEDICARE | Attending: Orthopaedic Surgery | Primary: Cardiovascular Disease

## 2019-10-13 ENCOUNTER — Telehealth: Attending: Orthopaedic Surgery

## 2019-10-13 DIAGNOSIS — M1711 Unilateral primary osteoarthritis, right knee: Secondary | ICD-10-CM

## 2019-10-13 NOTE — Patient Instructions (Signed)
Knee Arthritis: Care Instructions  Your Care Instructions     Knee arthritis is a breakdown of the cartilage that cushions your knee joint. When the cartilage wears down, your bones rub against each other. This causes pain and stiffness. Knee arthritis tends to get worse with time.  Treatment for knee arthritis involves reducing pain, making the leg muscles stronger, and staying at a healthy body weight. The treatment usually does not improve the health of the cartilage, but it can reduce pain and improve how well your knee works.  You can take simple measures to protect your knee joints, ease your pain, and help you stay active.  Follow-up care is a key part of your treatment and safety. Be sure to make and go to all appointments, and call your doctor if you are having problems. It's also a good idea to know your test results and keep a list of the medicines you take.  How can you care for yourself at home?  ?? Know that knee arthritis will cause more pain on some days than on others.  ?? Stay at a healthy weight. Lose weight if you are overweight. When you stand up, the pressure on your knees from every pound of body weight is multiplied four times. So if you lose 10 pounds, you will reduce the pressure on your knees by 40 pounds.  ?? Talk to your doctor or physical therapist about exercises that will help ease joint pain.  ? Stretch to help prevent stiffness and to prevent injury before you exercise. You may enjoy gentle forms of yoga to help keep your knee joints and muscles flexible.  ? Walk instead of jog.  ? Ride a bike. This makes your thigh muscles stronger and takes pressure off your knee.  ? Wear well-fitting and comfortable shoes.  ? Exercise in chest-deep water. This can help you exercise longer with less pain.  ? Avoid exercises that include squatting or kneeling. They can put a lot of strain on your knees.   ? Talk to your doctor to make sure that the exercise you do is not making the arthritis worse.  ?? Do not sit for long periods of time. Try to walk once in a while to keep your knee from getting stiff.  ?? Ask your doctor or physical therapist whether shoe inserts may reduce your arthritis pain.  ?? If you can afford it, get new athletic shoes at least every year. This can help reduce the strain on your knees.  ?? Use a device to help you do everyday activities.  ? A cane or walking stick can help you keep your balance when you walk. Hold the cane or walking stick in the hand opposite the painful knee.  ? If you feel like you may fall when you walk, try using crutches or a front-wheeled walker. These can prevent falls that could cause more damage to your knee.  ? A knee brace may help keep your knee stable and prevent pain.  ? You also can use other things to make life easier, such as a higher toilet seat and handrails in the bathtub or shower.  ?? Take pain medicines exactly as directed.  ? Do not wait until you are in severe pain. You will get better results if you take it sooner.  ? If you are not taking a prescription pain medicine, take an over-the-counter medicine such as acetaminophen (Tylenol), ibuprofen (Advil, Motrin), or naproxen (Aleve). Read and follow all instructions on   the label.  ? Do not take two or more pain medicines at the same time unless the doctor told you to. Many pain medicines have acetaminophen, which is Tylenol. Too much acetaminophen (Tylenol) can be harmful.  ? Tell your doctor if you take a blood thinner, have diabetes, or have allergies to shellfish.  ?? Ask your doctor if you might benefit from a shot of steroid medicine into your knee. This may provide pain relief for several months.   ?? Many people take the supplements glucosamine and chondroitin for osteoarthritis. Some people feel they help, but the medical research does not show that they work. Talk to your doctor before you take these supplements.  When should you call for help?   Call your doctor now or seek immediate medical care if:  ?? ?? You have sudden swelling, warmth, or pain in your knee.   ?? ?? You have knee pain and a fever or rash.   ?? ?? You have such bad pain that you cannot use your knee.   Watch closely for changes in your health, and be sure to contact your doctor if you have any problems.  Where can you learn more?  Go to https://www.healthwise.net/GoodHelpConnections  Enter W187 in the search box to learn more about "Knee Arthritis: Care Instructions."  Current as of: July 26, 2018??????????????????????????????Content Version: 12.6  ?? 2006-2020 Healthwise, Incorporated.   Care instructions adapted under license by [employer] (which disclaims liability or warranty for this information). If you have questions about a medical condition or this instruction, always ask your healthcare professional. Healthwise, Incorporated disclaims any warranty or liability for your use of this information.

## 2019-10-13 NOTE — Progress Notes (Signed)
Name: Antonio Perez    DOB: 12/22/1950     Service Dept: [facility]    Patient's Pharmacies:    [place], [address]  [address]  [place] [address]  Phone: [phone] Fax: [phone]       Chief Complaint   Patient presents with   ??? Knee Pain   ??? Surgical Follow-up        There were no vitals taken for this visit.   Allergies   Allergen Reactions   ??? Ambien [Zolpidem] Other (comments)     Hyperactivity    ??? Other Medication Other (comments)     Seasonal allergies      Current Outpatient Medications   Medication Sig Dispense Refill   ??? ALPRAZolam (XANAX) 0.5 mg tablet      ??? oxyCODONE-acetaminophen (Percocet) 5-325 mg per tablet Take 1 Tab by mouth every four to six (4-6) hours as needed for Pain for up to 14 days. Max Daily Amount: 6 Tabs. This Rx is to only be used after surgical procedure 30 Tab 0   ??? cetirizine (ZYRTEC) 10 mg tablet Take 10 mg by mouth daily.     ??? apixaban (ELIQUIS) 5 mg tablet Take 5 mg by mouth two (2) times a day.     ??? carvedilol (COREG) 25 mg tablet Take 25 mg by mouth two (2) times daily (with meals).        Patient Active Problem List   Diagnosis Code   ??? Knee osteoarthritis M17.10      Family History   Problem Relation Age of Onset   ??? Thyroid Disease Brother       Social History     Socioeconomic History   ??? Marital status: SINGLE     Spouse name: Not on file   ??? Number of children: Not on file   ??? Years of education: Not on file   ??? Highest education level: Not on file   Tobacco Use   ??? Smoking status: Never Smoker   ??? Smokeless tobacco: Never Used   Substance and Sexual Activity   ??? Alcohol use: Yes     Comment: occ   ??? Drug use: No   ??? Sexual activity: Not Currently      Past Surgical History:   Procedure Laterality Date   ??? COLONOSCOPY N/A 12/01/2017    COLONOSCOPY, DIAGNOSTIC with cold snare polypectomy performed by Barkley Bruns, MD at [facility] ENDOSCOPY    ??? HX LITHOTRIPSY     ??? HX PROSTATECTOMY  2000    [place], [place]   ??? HX UROLOGICAL      stent insertion   ??? HX UROLOGICAL      stent removal      Past Medical History:   Diagnosis Date   ??? Atrial fibrillation ([place]) 2011   ??? Essential hypertension    ??? Heart disease    ??? High cholesterol    ??? Kidney stone    ??? Prostate cancer ([place]) 2000        I have reviewed and agree with [name] and ROS and intake form in chart and the record furthermore I have reviewed prior medical record(s) regarding this patients care during this appointment.     Review of Systems:   Patient is a pleasant appearing individual, appropriately dressed, well hydrated, well nourished, who is alert, appropriately oriented for  age, and in no acute distress with a normal gait and normal affect who does not appear to be in any significant pain.     Physical Exam:  Right knee - Neurovascularly intact with good cap refill, full range of motion and full strength, well healed incision noted, no swelling, no erythema, no instability.     Left knee - Decrease range of motion with flexion, Some crepitation, Grossly neurovascularly intact, Good cap refill, No skin lesion, Moderate swelling, No gross instability, Some quadriceps weakness    Antonio Perez, who was evaluated through a synchronous (real-time) audio-video encounter, and/or his healthcare decision maker, is aware that it is a billable service, with coverage as determined by his insurance carrier. He provided verbal consent to proceed: Yes, and patient identification was verified. It was conducted pursuant to the emergency declaration under the [place], [place] waiver authority and the [employer] Act. A caregiver was present when appropriate. Ability to conduct physical exam was limited. I was in the office. The patient was at home.    Encounter Diagnoses     ICD-10-CM ICD-9-CM    1. Primary osteoarthritis of right knee  M17.11 715.16       Physical examination shows he has got some ecchymosis, good capillary refill, and some bruising and swelling.  Otherwise, he is doing well.      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  Just a little bit of soreness.  He is using Eliquis for knee replacement DVT prophylaxis.    Assessment/Plan:  Plan at this point, ice, elevate.  We will see him back on yearly appointment.  No antiinflammatories because of the Eliquis.  If he gets worse, he is to give me a call.        As part of continued conservative pain management options the patient was advised to utilize Tylenol or OTC NSAIDS as long as it is not medically contraindicated.     Return to Office:   Follow-up and Dispositions    ?? Return in about 1 year (around 10/12/2020) for w/ X-rays.           Scribed by Derry Skill, MD as dictated by [name]. Posey Pronto, MD.  Documentation True and Accepted Darneshia Demary A. Posey Pronto, MD

## 2019-10-13 NOTE — Progress Notes (Signed)
Name: Antonio Perez    DOB: Jul 26, 1951     Service Dept: [facility]    Patient's Pharmacies:    [place], [address]  [address]  [place] [address]  Phone: [phone] Fax: [phone]       Chief Complaint   Patient presents with   ??? Knee Pain   ??? Surgical Follow-up        There were no vitals taken for this visit.   Allergies   Allergen Reactions   ??? Ambien [Zolpidem] Other (comments)     Hyperactivity    ??? Other Medication Other (comments)     Seasonal allergies      Current Outpatient Medications   Medication Sig Dispense Refill   ??? ALPRAZolam (XANAX) 0.5 mg tablet      ??? oxyCODONE-acetaminophen (Percocet) 5-325 mg per tablet Take 1 Tab by mouth every four to six (4-6) hours as needed for Pain for up to 14 days. Max Daily Amount: 6 Tabs. This Rx is to only be used after surgical procedure 30 Tab 0   ??? cetirizine (ZYRTEC) 10 mg tablet Take 10 mg by mouth daily.     ??? apixaban (ELIQUIS) 5 mg tablet Take 5 mg by mouth two (2) times a day.     ??? carvedilol (COREG) 25 mg tablet Take 25 mg by mouth two (2) times daily (with meals).        Patient Active Problem List   Diagnosis Code   ??? Knee osteoarthritis M17.10      Family History   Problem Relation Age of Onset   ??? Thyroid Disease Brother       Social History     Socioeconomic History   ??? Marital status: SINGLE     Spouse name: Not on file   ??? Number of children: Not on file   ??? Years of education: Not on file   ??? Highest education level: Not on file   Tobacco Use   ??? Smoking status: Never Smoker   ??? Smokeless tobacco: Never Used   Substance and Sexual Activity   ??? Alcohol use: Yes     Comment: occ   ??? Drug use: No   ??? Sexual activity: Not Currently      Past Surgical History:   Procedure Laterality Date   ??? COLONOSCOPY N/A 12/01/2017    COLONOSCOPY, DIAGNOSTIC with cold snare polypectomy performed by Barkley Bruns, MD at [facility] ENDOSCOPY   ??? HX  LITHOTRIPSY     ??? HX PROSTATECTOMY  2000    [place], [place]   ??? HX UROLOGICAL      stent insertion   ??? HX UROLOGICAL      stent removal      Past Medical History:   Diagnosis Date   ??? Atrial fibrillation ([place]) 2011   ??? Essential hypertension    ??? Heart disease    ??? High cholesterol    ??? Kidney stone    ??? Prostate cancer ([place]) 2000        I have reviewed and agree with [place] and ROS and intake form in chart and the record furthermore I have reviewed prior medical record(s) regarding this patients care during this appointment.     Review of Systems:   Patient is a pleasant appearing individual, appropriately dressed, well hydrated, well nourished, who is alert, appropriately oriented for  age, and in no acute distress with a normal gait and normal affect who does not appear to be in any significant pain.     Physical Exam:  Right knee - Neurovascularly intact with good cap refill, full range of motion and full strength, well healed incision noted, no swelling, no erythema, no instability.     Left knee - Decrease range of motion with flexion, Some crepitation, Grossly neurovascularly intact, Good cap refill, No skin lesion, Moderate swelling, No gross instability, Some quadriceps weakness    Antonio Perez, who was evaluated through a synchronous (real-time) audio-video encounter, and/or his healthcare decision maker, is aware that it is a billable service, with coverage as determined by his insurance carrier. He provided verbal consent to proceed: Yes, and patient identification was verified. It was conducted pursuant to the emergency declaration under the [place], [place] waiver authority and the [employer] and [employer] Act. A caregiver was present when appropriate. Ability to conduct physical exam was limited. I was in the office. The patient was at home.    Encounter Diagnoses     ICD-10-CM ICD-9-CM   1. Primary osteoarthritis of right knee   M17.11 715.16       Physical examination shows he has got some ecchymosis, good capillary refill, and some bruising and swelling.  Otherwise, he is doing well.      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  Just a little bit of soreness.  He is using Eliquis for knee replacement DVT prophylaxis.    Assessment/Plan:  Plan at this point, ice, elevate.  We will see him back on yearly appointment.  No antiinflammatories because of the Eliquis.  If he gets worse, he is to give me a call.        As part of continued conservative pain management options the patient was advised to utilize Tylenol or OTC NSAIDS as long as it is not medically contraindicated.     Return to Office:   Follow-up and Dispositions    ?? Return in about 1 year (around 10/12/2020) for w/ X-rays.           Scribed by Derry Skill, MD as dictated by [name]. Posey Pronto, MD.  Documentation True and Accepted Jaycie Kregel A. Posey Pronto, MD

## 2020-10-05 ENCOUNTER — Encounter

## 2020-10-12 ENCOUNTER — Encounter: Attending: Orthopaedic Surgery | Primary: Cardiovascular Disease

## 2020-10-22 IMAGING — DX DG CHEST 2V
2 series · 2 of 2 positions shown · non-contrast
Comparison: None

CLINICAL DATA: Fever, body aches, abdominal pain

EXAM:
CHEST - 2 VIEW

[chest pa]
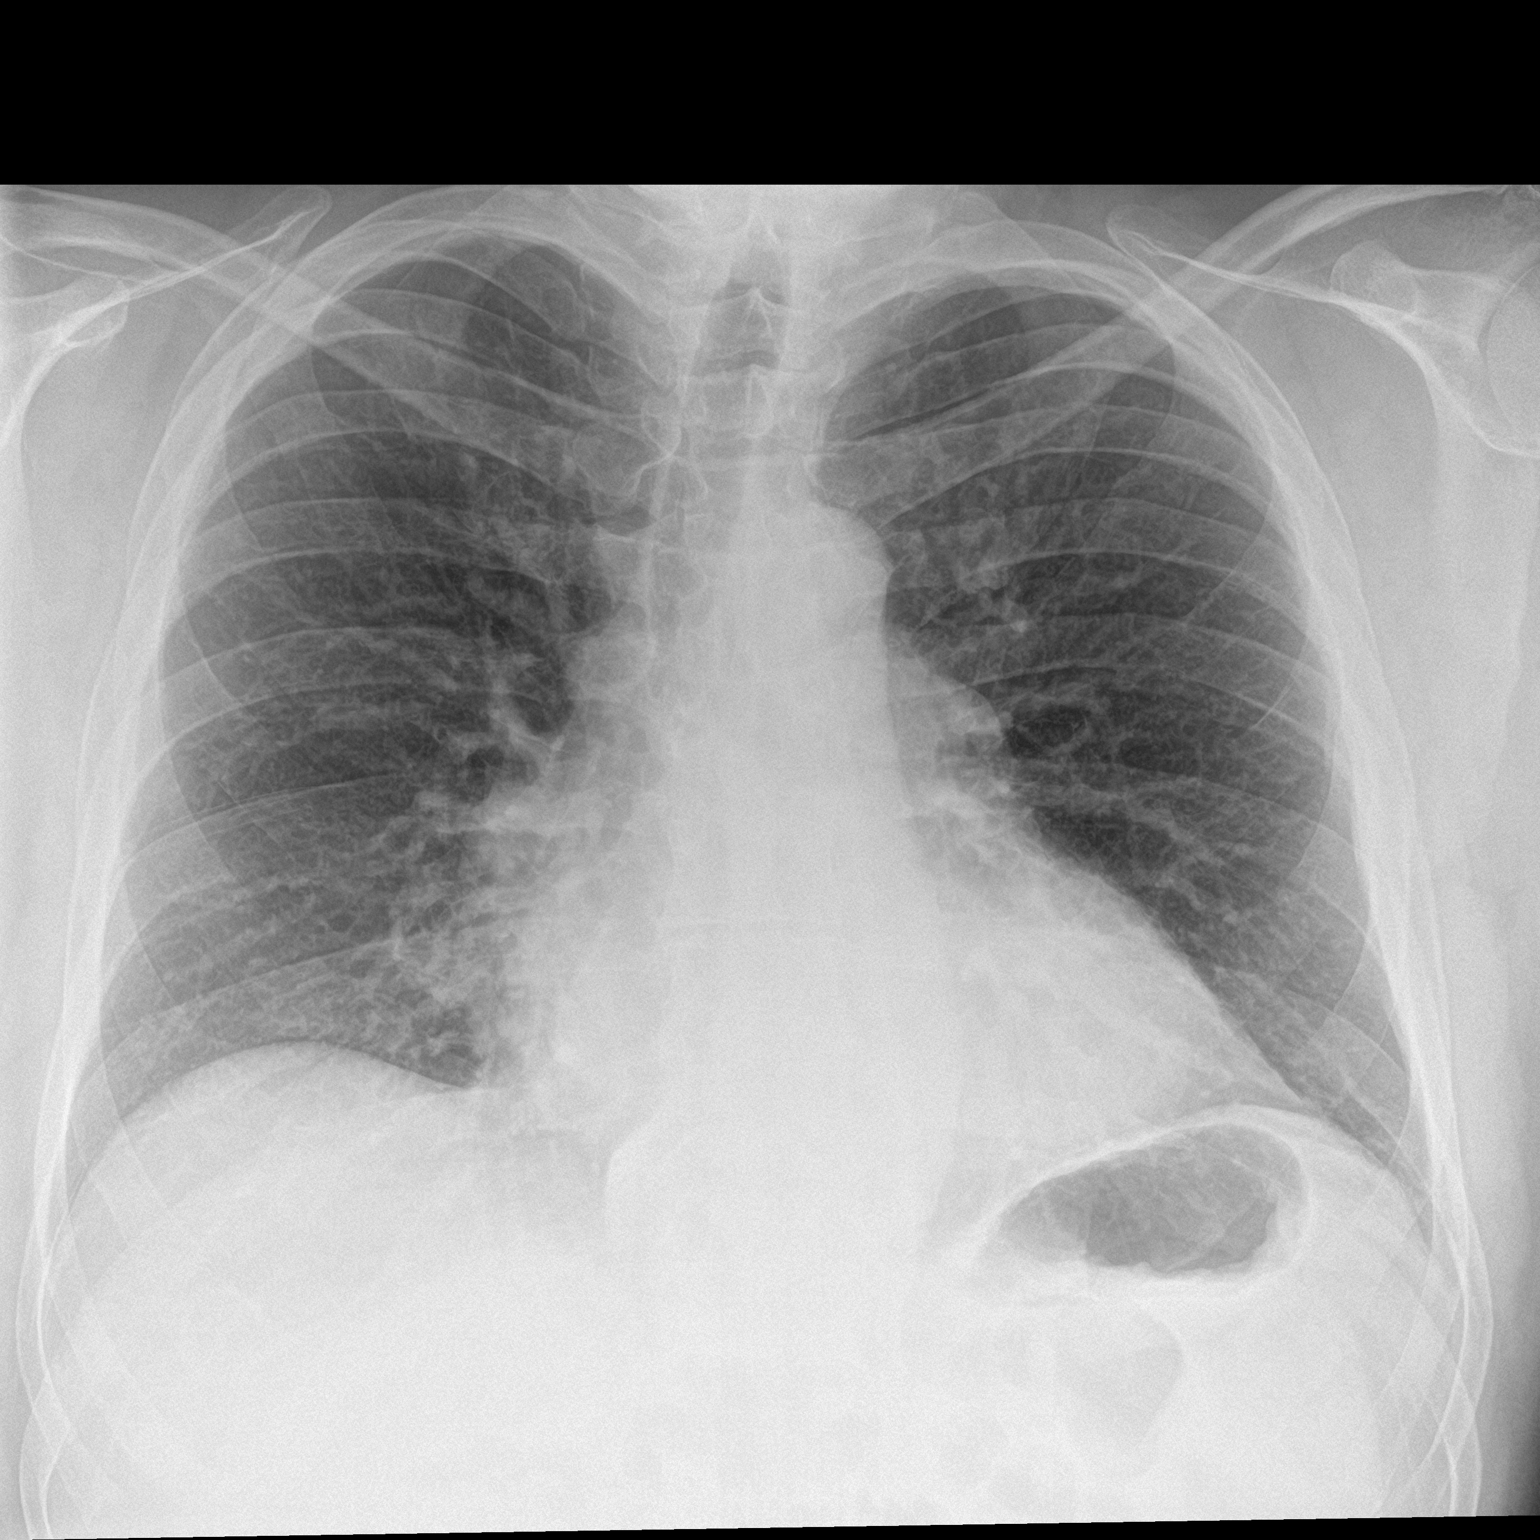

[chest lat]
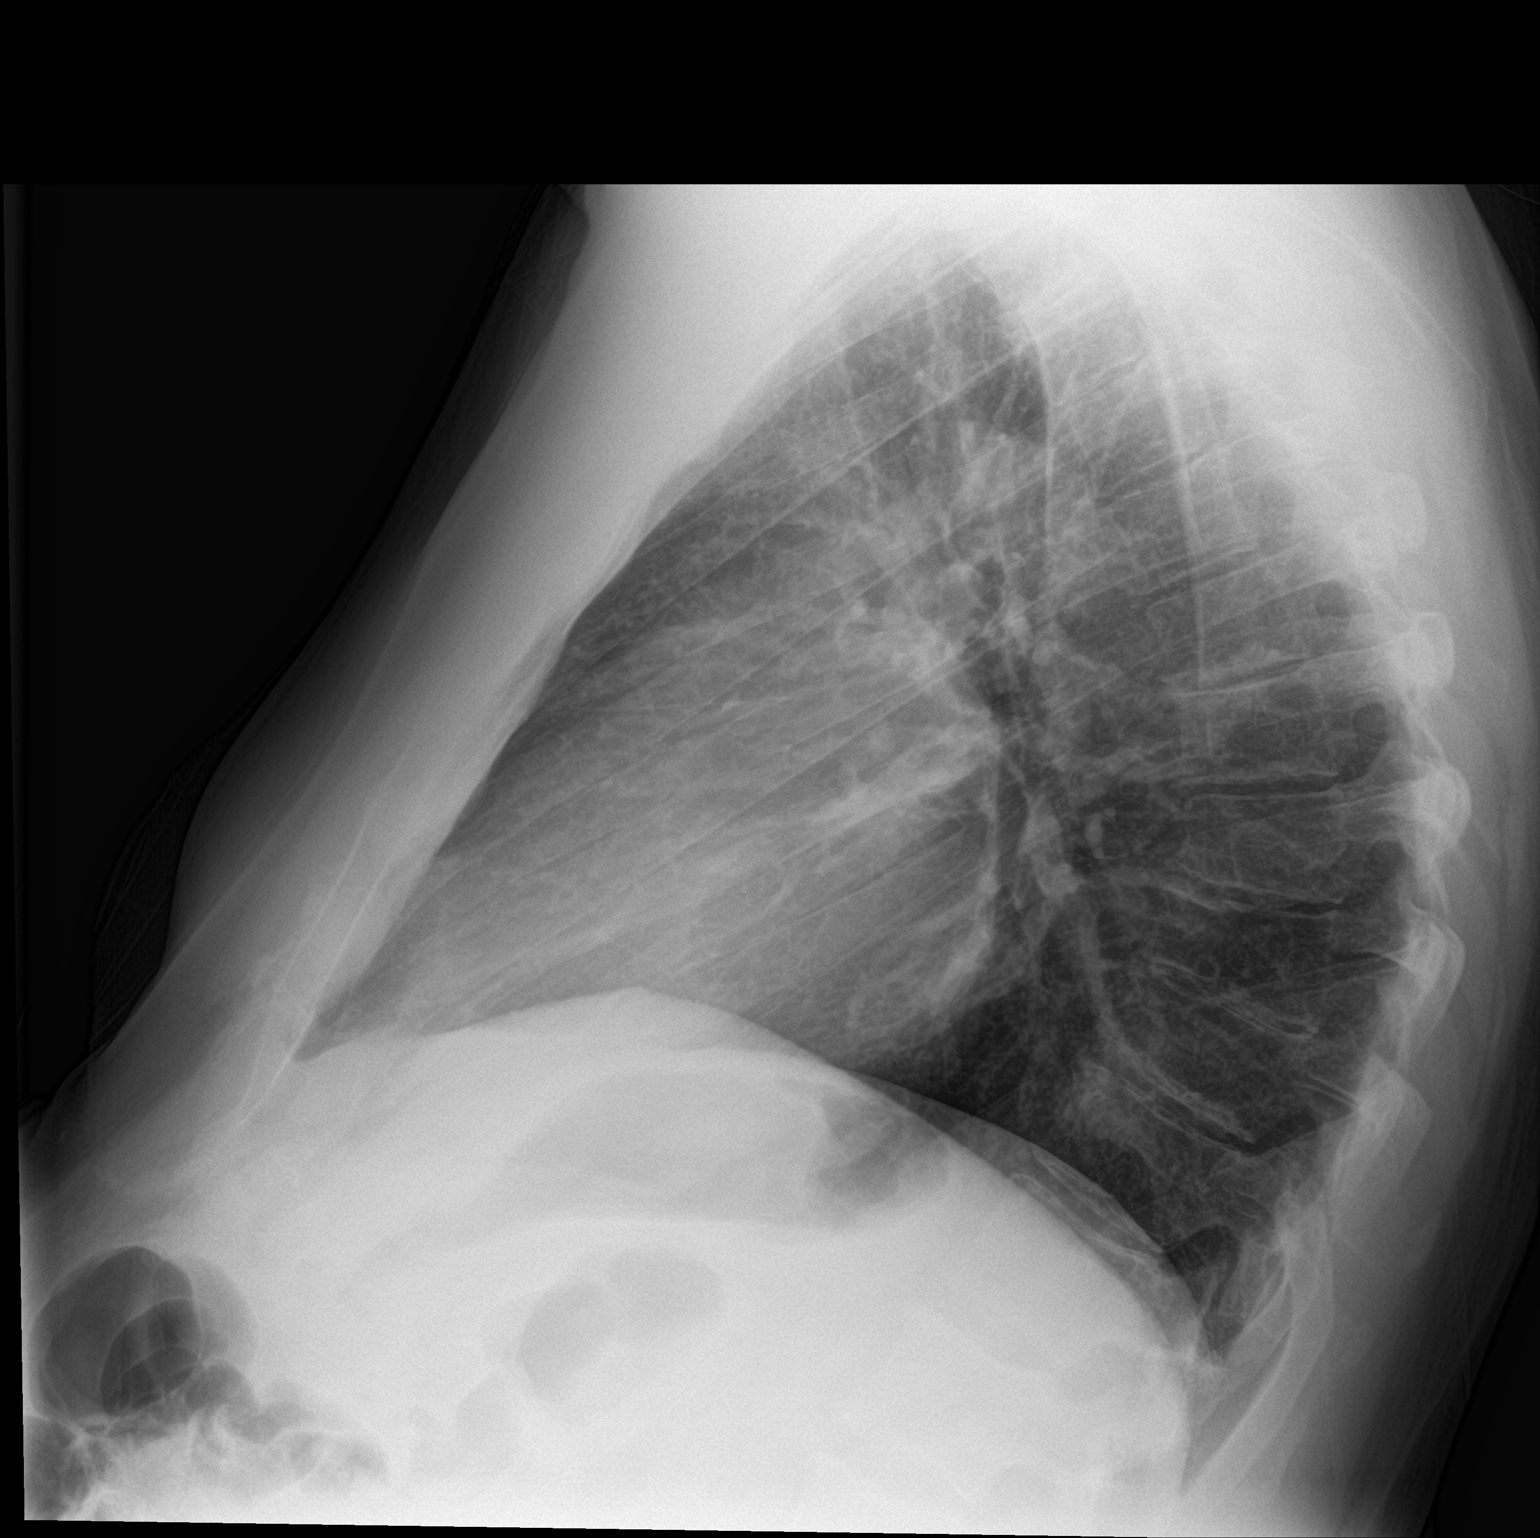

[2 of 2 positions shown; findings below may reference images not displayed]

FINDINGS: Enlargement of cardiac silhouette.

Mediastinal contours and pulmonary vascularity normal.

Subsegmental atelectasis RIGHT base.

Lungs otherwise clear.

No pleural effusion or pneumothorax.

Mild endplate spur formation lower thoracic spine.

Bones appear demineralized.
IMPRESSION: Enlargement of cardiac silhouette with mild RIGHT basilar
atelectasis.

## 2022-05-08 DIAGNOSIS — I4891 Unspecified atrial fibrillation: Secondary | ICD-10-CM | POA: Diagnosis not present

## 2022-05-08 DIAGNOSIS — I10 Essential (primary) hypertension: Secondary | ICD-10-CM | POA: Diagnosis not present

## 2023-12-04 ENCOUNTER — Encounter (HOSPITAL_COMMUNITY): Payer: Self-pay | Admitting: Physician Assistant

## 2023-12-04 ENCOUNTER — Ambulatory Visit (HOSPITAL_COMMUNITY)
Admission: RE | Admit: 2023-12-04 | Discharge: 2023-12-04 | Disposition: A | Discharge status: Home / Self Care | Source: Ambulatory Visit | Attending: Physician Assistant | Admitting: Physician Assistant

## 2023-12-04 VITALS — BP 148/100 | HR 66 | Ht 74.0 in | Wt 249.0 lb

## 2023-12-04 DIAGNOSIS — Z7901 Long term (current) use of anticoagulants: Secondary | ICD-10-CM | POA: Diagnosis not present

## 2023-12-04 DIAGNOSIS — I5022 Chronic systolic (congestive) heart failure: Secondary | ICD-10-CM | POA: Insufficient documentation

## 2023-12-04 DIAGNOSIS — E785 Hyperlipidemia, unspecified: Secondary | ICD-10-CM | POA: Insufficient documentation

## 2023-12-04 DIAGNOSIS — I4821 Permanent atrial fibrillation: Secondary | ICD-10-CM | POA: Diagnosis not present

## 2023-12-04 DIAGNOSIS — I10 Essential (primary) hypertension: Secondary | ICD-10-CM | POA: Diagnosis not present

## 2023-12-04 DIAGNOSIS — Z79899 Other long term (current) drug therapy: Secondary | ICD-10-CM | POA: Diagnosis not present

## 2023-12-04 DIAGNOSIS — D6869 Other thrombophilia: Secondary | ICD-10-CM | POA: Diagnosis not present

## 2023-12-04 DIAGNOSIS — I509 Heart failure, unspecified: Secondary | ICD-10-CM | POA: Diagnosis present

## 2023-12-04 DIAGNOSIS — I110 Hypertensive heart disease with heart failure: Secondary | ICD-10-CM | POA: Diagnosis present

## 2023-12-04 HISTORY — DX: Unspecified atrial fibrillation: I48.91

## 2023-12-04 HISTORY — DX: Hyperlipidemia, unspecified: E78.5

## 2023-12-04 HISTORY — DX: Essential (primary) hypertension: I10

## 2023-12-04 HISTORY — DX: Heart failure, unspecified: I50.9

## 2023-12-04 NOTE — Progress Notes (Signed)
 Primary Care Physician: Pcp, No Primary Cardiologist: None Electrophysiologist: None  Referring Physician: Self-referred    Omar Byrd is a 73 y.o. male with a history of CHF, HLD, HTN, atrial fibrillation who presents for consultation in the [facility]. [place] The patient was initially diagnosed with atrial fibrillation in 2010 in the setting of a viral infection. Per patient report, his EF was ~15% at that time. After undergoing multiple DCCVs, a rate control strategy was adopted. His latest echo from [facility] on 10/2022 showed EF 50%. Patient is on Eliquis for stroke prevention.   Today, patient remains in rate controlled afib and feels well. No bleeding issues on anticoagulation.   Today, he denies symptoms of palpitations, chest pain, shortness of breath, orthopnea, PND, lower extremity edema, dizziness, presyncope, syncope, snoring, daytime somnolence, bleeding, or neurologic sequela. The patient is tolerating medications without difficulties and is otherwise without complaint today.    Atrial Fibrillation Risk Factors:  he does not have symptoms or diagnosis of sleep apnea. he does not have a history of rheumatic fever.   Atrial Fibrillation Management history:  Previous antiarrhythmic drugs: none Previous cardioversions: remotely  Previous ablations: none Anticoagulation history: Eliquis  ROS- All systems are reviewed and negative except as per the HPI above.  Past Medical History:  Diagnosis Date   Atrial fibrillation (HCC)    CHF (congestive heart failure) (HCC)    Hyperlipidemia    Hypertension     Current Outpatient Medications  Medication Sig Dispense Refill   ALPRAZolam (XANAX) 0.5 MG tablet Take 0.5 mg by mouth at bedtime as needed.     amoxicillin (AMOXIL) 500 MG capsule Take 1,000 mg by mouth 2 (two) times daily as needed (dental work).     carvedilol  (COREG ) 12.5 MG tablet Take  12.5 mg by mouth 2 (two) times daily.     cetirizine (RA CETIRIZINE) 10 MG tablet Take by mouth.     chlorhexidine (PERIDEX) 0.12 % solution Use as directed 5 mLs in the mouth or throat 2 (two) times daily.     digoxin (LANOXIN) 0.25 MG tablet Take 0.25 mg by mouth daily.     ELIQUIS 5 MG TABS tablet Take 5 mg by mouth 2 (two) times daily.     escitalopram (LEXAPRO) 10 MG tablet Take 10 mg by mouth daily.     mometasone (NASONEX) 50 MCG/ACT nasal spray Place 2 sprays into the nose daily as needed (allergies).     No current facility-administered medications for this encounter.    Physical Exam: BP (!) 148/100   Pulse 66   Ht 6' 2 (1.88 m)   Wt 112.9 kg   BMI 31.97 kg/m   GEN: Well nourished, well developed in no acute distress NECK: No JVD; No carotid bruits CARDIAC: Irregularly irregular rate and rhythm, no murmurs, rubs, gallops RESPIRATORY:  Clear to auscultation without rales, wheezing or rhonchi  ABDOMEN: Soft, non-tender, non-distended EXTREMITIES:  No edema; No deformity   Wt Readings from Last 3 Encounters:  12/04/23 112.9 kg     EKG today demonstrates  Afib Vent. rate 66 BPM PR interval * ms QRS duration 120 ms QT/QTcB 412/431 ms  Echo from [facility] scanned into Media tab.   CHA2DS2-VASc Score = 3  The patient's score is based upon: CHF History: 1 HTN History: 1 Diabetes History: 0 Stroke History: 0 Vascular Disease History: 0 Age Score: 1 Gender Score: 0  ASSESSMENT AND PLAN: Permanent Atrial Fibrillation (ICD10:  I48.11) The patient's CHA2DS2-VASc score is 3, indicating a 3.2% annual risk of stroke.   Diagnosed in 2010 and has remained in afib since that time. Patient not interested in rhythm control which I think is reasonable given the length of time he has been in afib.  Continue carvedilol  12.5 mg BID Continue digoxin 0.25 mg daily Continue Eliquis 5 mg BID  Secondary Hypercoagulable State (ICD10:  D68.69) The patient is at significant  risk for stroke/thromboembolism based upon his CHA2DS2-VASc Score of 3.  Continue Apixaban (Eliquis).   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  Will refer him to establish care with a primary cardiologist.   HLD Patient has h/o statin intolerance Previously on Repatha  but this was discontinued due to cost.     Follow up in the AF clinic as needed. Will refer him to establish care with a primary cardiologist.        Daril Kicks PA-C [facility] [address] [place], [place] [address] [phone]

## 2024-05-02 ENCOUNTER — Telehealth: Payer: Self-pay | Admitting: Pharmacy Technician

## 2024-05-02 ENCOUNTER — Other Ambulatory Visit (HOSPITAL_COMMUNITY): Payer: Self-pay

## 2024-05-02 ENCOUNTER — Ambulatory Visit: Attending: Internal Medicine | Admitting: Internal Medicine

## 2024-05-02 ENCOUNTER — Encounter: Payer: Self-pay | Admitting: Internal Medicine

## 2024-05-02 VITALS — BP 153/101 | HR 69 | Ht 74.0 in | Wt 247.3 lb

## 2024-05-02 DIAGNOSIS — I5022 Chronic systolic (congestive) heart failure: Secondary | ICD-10-CM | POA: Insufficient documentation

## 2024-05-02 DIAGNOSIS — Z79899 Other long term (current) drug therapy: Secondary | ICD-10-CM | POA: Diagnosis not present

## 2024-05-02 DIAGNOSIS — E785 Hyperlipidemia, unspecified: Secondary | ICD-10-CM | POA: Insufficient documentation

## 2024-05-02 DIAGNOSIS — I428 Other cardiomyopathies: Secondary | ICD-10-CM

## 2024-05-02 DIAGNOSIS — I10 Essential (primary) hypertension: Secondary | ICD-10-CM

## 2024-05-02 DIAGNOSIS — T466X5D Adverse effect of antihyperlipidemic and antiarteriosclerotic drugs, subsequent encounter: Secondary | ICD-10-CM

## 2024-05-02 DIAGNOSIS — I4821 Permanent atrial fibrillation: Secondary | ICD-10-CM | POA: Diagnosis not present

## 2024-05-02 DIAGNOSIS — T466X5A Adverse effect of antihyperlipidemic and antiarteriosclerotic drugs, initial encounter: Secondary | ICD-10-CM | POA: Insufficient documentation

## 2024-05-02 DIAGNOSIS — M791 Myalgia, unspecified site: Secondary | ICD-10-CM

## 2024-05-02 MED ORDER — CARVEDILOL 25 MG PO TABS: 25.0000 mg | ORAL_TABLET | Freq: Two times a day (BID) | ORAL | 3 refills | Status: DC

## 2024-05-02 MED ORDER — SACUBITRIL-VALSARTAN 49-51 MG PO TABS: 1.0000 | ORAL_TABLET | Freq: Two times a day (BID) | ORAL | 3 refills | Status: DC

## 2024-05-02 NOTE — Telephone Encounter (Addendum)
 Patient updated via MyChart

## 2024-05-02 NOTE — Progress Notes (Signed)
 OFFICE NOTE  Chief Complaint:  Establish cardiology  Primary Care Physician: Omar Yvonna SQUIBB, PA-C  HPI:  Omar Byrd is a 73 y.o. male with a past medial history significant for chronic systolic congestive heart failure and atrial fibrillation.  He presented in acute systolic congestive heart failure with rapid atrial fibrillation around 2010.  LVEF at that time was about 15%.  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  Cardiac catheterization was performed and showed normal coronaries.  Subsequently over time and with medical therapy his LVEF had improved up to 35 to 40% in 2021 by echo and up to 50% in 2024.  He is also noted to have left atrial and right atrial enlargement as well as diastolic dysfunction.  Medical therapy for heart failure is included ARB, beta-blocker and digoxin.  He reports over the past several years she has had poor blood pressure control.  Today blood pressure is 153/101.  He is recent blood pressure in the new primary care provider's office was 140/100.  He was just seen by cardiology 4 days ago at the [facility] and they recommended adding amlodipine 5 mg daily to his medical regimen.  Blood pressure then was 150/102.  He did have extensive lab work which showed normal CBC and BMET, total cholesterol 239, triglyceride 126, HDL 43 and LDL 171.  He has been intolerant to statins in the past.  He was prescribed Repatha  but never had the medicine filled due to cost issues.  He said he never got a prior authorization for it.  The previous cardiologist had worked to try to get him on Leqvio however that was never established either.  He also has a history of prostate cancer and recent PSA was still undetectable.  Currently says he is asymptomatic denying chest pain or shortness of breath with exertion.  Denies any swelling or nonproductive cough, orthopnea or heart failure symptoms.  He remains very active and has no limiting  symptoms.  PMHx:  Past Medical History:  Diagnosis Date   Atrial fibrillation (HCC)    CHF (congestive heart failure) (HCC)    Hyperlipidemia    Hypertension    Prostate cancer (HCC)     No past surgical history on file.  FAMHx:  No family history on file.  SOCHx:   reports that he has never smoked. He has never used smokeless tobacco. He reports current alcohol use of about 2.0 - 4.0 standard drinks of alcohol per week. He reports that he does not use drugs.  ALLERGIES:  Allergies  Allergen Reactions   Ambien  [Zolpidem]     Other reaction(s): Hallucinations   Fentanyl     Other Reaction(s): Other  sneezing   Diphenhydramine Hcl    Simvastatin     Muscle aches    ROS: Pertinent items noted in HPI and remainder of comprehensive ROS otherwise negative.  HOME MEDS: Current Outpatient Medications on File Prior to Visit  Medication Sig Dispense Refill   ALPRAZolam (XANAX) 0.5 MG tablet Take 0.5 mg by mouth at bedtime as needed.     amoxicillin (AMOXIL) 500 MG capsule Take 1,000 mg by mouth 2 (two) times daily as needed (dental work).     cetirizine (RA CETIRIZINE) 10 MG tablet Take by mouth.     chlorhexidine (PERIDEX) 0.12 % solution Use as directed 5 mLs in the mouth or throat 2 (two) times daily.     digoxin (LANOXIN) 0.25 MG tablet Take 0.25  mg by mouth daily.     ELIQUIS 5 MG TABS tablet Take 5 mg by mouth 2 (two) times daily.     escitalopram (LEXAPRO) 10 MG tablet Take 10 mg by mouth daily.     mometasone (NASONEX) 50 MCG/ACT nasal spray Place 2 sprays into the nose daily as needed (allergies).     No current facility-administered medications on file prior to visit.    LABS/IMAGING: No results found for this or any previous visit (from the past 48 hours). No results found.  LIPID PANEL: No results found for: CHOL, TRIG, HDL, CHOLHDL, VLDL, LDLCALC, LDLDIRECT   WEIGHTS: Wt Readings from Last 3 Encounters:  05/02/24 247 lb 4.8 oz (112.2 kg)   12/04/23 249 lb (112.9 kg)    VITALS: BP (!) 153/101 (BP Location: Left Arm, Patient Position: Sitting)   Pulse 69   Ht 6' 2 (1.88 m)   Wt 247 lb 4.8 oz (112.2 kg)   SpO2 98%   BMI 31.75 kg/m   EXAM: General appearance: alert and no distress Neck: no carotid bruit, no JVD, and thyroid not enlarged, symmetric, no tenderness/mass/nodules Lungs: clear to auscultation bilaterally Heart: regular rate and rhythm, S1, S2 normal, no murmur, click, rub or gallop Abdomen: soft, non-tender; bowel sounds normal; no masses,  no organomegaly Extremities: extremities normal, atraumatic, no cyanosis or edema Pulses: 2+ and symmetric Skin: Skin color, texture, turgor normal. No rashes or lesions Neurologic: Grossly normal Psych: Pleasant  EKG: Personally reviewed EKG performed at [facility] cardiology (04/28/2024), demonstrating atrial fibrillation at 90, incomplete left bundle branch block- personally reviewed  ASSESSMENT: Chronic systolic heart failure, LVEF 50% (2024), improved from 15% in 2010, NYHA class I symptoms Permanent atrial fibrillation on Eliquis Angiographically normal coronaries by cath in 2011 Uncontrolled hypertension Dyslipidemia, goal LDL less than 70 Statin intolerance-myalgias History of prostate cancer  PLAN: 1.   Omar Byrd has chronic systolic heart failure with LVEF that is improved up to 50% as of 2024.  He has permanent atrial fibrillation which is rate controlled and biatrial enlargement on echo.  Will plan to repeat echo likely next year.  Blood pressure is poorly controlled.  He has recent visit to cardiology there was a suggestion of adding amlodipine.  I would like to further titrate his medications and advise doubling his carvedilol  to 25 mg twice a day.  Will then switch his telmisartan to Entresto  49/51 twice daily.  Have encouraged him to remain well-hydrated will need to check a BMP at in about 2 weeks.  In addition he is on no lipid-lowering therapies.   His LDL currently is over 170.  He is intolerant to statins and I would advise starting Repatha  140 mg every 2 weeks.  Will reach out for prior authorization for this.  Plan repeat lipids including NMR and LP(a) in about 3 months and follow-up with me at that time.  We can then further discuss titrating GDMT including adding an SGLT2 inhibitor.  Ultimately we may want to consider stopping digoxin given minimal benefits with his improved LVEF and fairly good rate control.  Recent digoxin level 0.3.  Vinie KYM Maxcy, MD, [facility], FNLA, FACP  [place]  [facility]  Medical Director of the Advanced Lipid Disorders &  Cardiovascular Risk Reduction Clinic Diplomate of the American Board of Clinical Lipidology Attending Cardiologist  Direct Dial: [phone]  Fax: [phone]  Website:  [email or web address] Vinie BROCKS Verda Mehta 05/02/2024, 9:07 AM

## 2024-05-02 NOTE — Patient Instructions (Signed)
 Medication Instructions:  STOP olmesartan   START entresto  49/51mg  twice daily  INCREASE carvedilol  to 25mg  twice daily   Dr. Mona recommends Repatha  140mg /ml (PCSK9). This is an injectable cholesterol medication self-administered once every 14 days. This medication will likely need prior approval with your insurance company, which we will work on. If the medication is not approved initially, we may need to do an appeal with your insurance. If approved, we will provide you with copay and cost information. We'll then send the prescription to your pharmacy. We would have you complete another set of fasting labs between 3-4 months to reassess cholesterol.   Repatha  is self-injected once every 14 days in subcutaneous or fatty tissue - such as belly or side/outer/upper thigh. It is best stored in the refrigerator but is stable at room temp up to 28 days. Please take the pen-injector out of fridge about 30 minutes - 1 hour prior to injection, to allow it to warm closer to room temperature.   This medication is very effective in lowering LDL and can lower LPa, as Dr. Mona mentioned. It is also generally well tolerated -- most common reaction may be cold-like symptoms such as runny nose, scratchy throat, as this is an antibody therapy. It is generally self-limiting and after a few doses, your body should have normalized to the medication.   Here is a demo video: https://www.repatha .com/how-to-start-repatha -injection   Patient Assistance:    These foundations have funds at various times.   The [employer]: https://www.panfoundation.org/disease-funds/hypercholesterolemia/ -- can sign up for wait list  The [facility] offers assistance to help pay for medication copays.  They will cover copays for all cholesterol lowering meds, including statins, fibrates, omega-3 fish oils like Vascepa, ezetimibe, Repatha , Praluent, Nexletol, Nexlizet.  The cards are usually good for $2,500 or 12 months,  whichever comes first. Our fax # is [phone] (you will need this to apply) Go to healthwellfoundation.org Click on "Apply Now" Answer questions as to whom is applying (patient or representative) Your disease fund will be "hypercholesterolemia - Medicare access" They will ask questions about finances and which medications you are taking for cholesterol When you submit, the approval is usually within minutes.  You will need to print the card information from the site You will need to show this information to your pharmacy, they will bill your Medicare Part D plan first -then bill [employer] --for the copay.   You can also call them at [phone], although the hold times can be quite long.     *If you need a refill on your cardiac medications before your next appointment, please call your pharmacy*  Lab Work: Non-Fasting BMET in 7-10 after changing from olmesartan to entresto    Fasting lab with 3-4 months -- NMR lipoprofile and LPa   If you have labs (blood work) drawn today and your tests are completely normal, you will receive your results only by: MyChart Message (if you have MyChart) OR A paper copy in the mail If you have any lab test that is abnormal or we need to change your treatment, we will call you to review the results.   Follow-Up: At [facility], you and your health needs are our priority.  As part of our continuing mission to provide you with exceptional heart care, our providers are all part of one team.  This team includes your primary Cardiologist (physician) and Advanced Practice Providers or APPs (Physician Assistants and Nurse Practitioners) who all work together to provide you with  the care you need, when you need it.  Your next appointment:    3-4 months -- with Dr. Mona or NP, PA  We recommend signing up for the patient portal called MyChart.  Sign up information is provided on this After Visit Summary.  MyChart is used to connect with  patients for Virtual Visits (Telemedicine).  Patients are able to view lab/test results, encounter notes, upcoming appointments, etc.  Non-urgent messages can be sent to your provider as well.   To learn more about what you can do with MyChart, go to [email or web address].

## 2024-05-02 NOTE — Telephone Encounter (Signed)
 Pharmacy Patient Advocate Encounter   Received notification from Physician's Office- jenna that prior authorization for repatha  is required/requested.   Insurance verification completed.   The patient is insured through [employer] .   Per test claim: PA required; PA submitted to above mentioned insurance via Latent Key/confirmation #/EOC [id number] Status is pending

## 2024-05-02 NOTE — Telephone Encounter (Signed)
 Pharmacy Patient Advocate Encounter  Received notification from [facility] that Prior Authorization for repatha  has been APPROVED from 05/02/24 to 10/30/24. Ran test claim, Copay is $0.00- one month. This test claim was processed through [facility]- copay amounts may vary at other pharmacies due to pharmacy/plan contracts, or as the patient moves through the different stages of their insurance plan.   PA #/Case ID/Reference #: [id number]

## 2024-05-04 MED ORDER — REPATHA SURECLICK 140 MG/ML ~~LOC~~ SOAJ: 140.0000 mg | SUBCUTANEOUS | 3 refills | Status: DC

## 2024-05-04 NOTE — Addendum Note (Signed)
 Addended by: LORING ANDRIETTE HERO on: 05/04/2024 07:45 AM   Modules accepted: Orders

## 2024-05-05 ENCOUNTER — Encounter: Payer: Self-pay | Admitting: Cardiology

## 2024-06-22 ENCOUNTER — Telehealth (HOSPITAL_BASED_OUTPATIENT_CLINIC_OR_DEPARTMENT_OTHER): Payer: Self-pay | Admitting: *Deleted

## 2024-06-22 NOTE — Telephone Encounter (Signed)
   Patient Name: Omar Byrd  DOB: 1950-08-19 MRN: [id number]  Primary Cardiologist: None  Chart reviewed as part of pre-operative protocol coverage. Given past medical history and time since last visit, based on ACC/AHA guidelines, Lowen Bagsby is at acceptable risk for the planned procedure without further cardiovascular testing.   Per Dr. Mona: 06/22/2024  I would not advise holding beta blocker since he has systolic heart failure and atrial fibrillation, both of which require the beta blocker to control - several days off medicine could lead to rebound symptoms. I would consider alternative allergy testing other than skin testing.   Dr. Mona  The patient was advised that if he develops new symptoms prior to surgery to contact our office to arrange for a follow-up visit, and he verbalized understanding.  I will route this recommendation to the requesting party via Epic fax function and remove from pre-op pool.  Please call with questions.  Lamarr Satterfield, NP 06/22/2024, 1:05 PM

## 2024-06-22 NOTE — Telephone Encounter (Signed)
   Pre-operative Risk Assessment    Patient Name: Omar Byrd  DOB: 1951/01/18 MRN: [id number]   Date of last office visit: 05/02/24 DR. HILTY Date of next office visit: 07/26/24 DR. HILTY   THIS REQUEST CAME TO OUR PREOP TEAM; HOWEVER I AM NOT SURE THIS IF APPROPRIATE FOR OUR PREOP TEAM. I HOWEVER, WILL FORWARD THIS TO THE PREOP APP.    Request for Surgical Clearance    Procedure:  ALLERGY SKIN TESTING   Date of Surgery:  Clearance 07/25/24                                Surgeon: NOT LISTED Surgeon's Group or Practice Name:  [facility]  Phone number:  [phone] Fax number:  [phone]   Type of Clearance Requested:   - Medical ; REQUEST IS ASKING FOR PT TO HOLD BETA BLOCKER X 3 DAYS PRIOR TO TESTING    Type of Anesthesia:  None    Additional requests/questions:    Signed, Rudene Poulsen   06/22/2024, 11:08 AM

## 2024-06-22 NOTE — Telephone Encounter (Signed)
 Dr. Mona,  You saw this patient on 05/02/2024. Provider is asking about holding coreg  for 3 days prior to allergy testing. Per protocol we request that you comment on his cardiac risk to hold BB. since it has been less than 2 months since evaluated in the office. Please send your comment to P CV Pre-Op Pool.  Thank you, Omar Satterfield DNP, ANP, AACC.

## 2024-06-22 NOTE — Telephone Encounter (Signed)
 I will defer to Dr. Mona. BB are not usually held for 3 days. I will send Dr. Mona a note.

## 2024-06-28 ENCOUNTER — Encounter: Payer: Self-pay | Admitting: Internal Medicine

## 2024-06-29 MED ORDER — AMLODIPINE BESYLATE 5 MG PO TABS: 5.0000 mg | ORAL_TABLET | Freq: Every day | ORAL | 3 refills | Status: DC

## 2024-06-29 MED ORDER — AMLODIPINE BESYLATE 5 MG PO TABS: 5.0000 mg | ORAL_TABLET | Freq: Every day | ORAL | 1 refills | Status: DC

## 2024-06-29 NOTE — Addendum Note (Signed)
 Addended by: LORING ANDRIETTE HERO on: 06/29/2024 02:36 PM   Modules accepted: Orders

## 2024-07-26 ENCOUNTER — Encounter: Payer: Self-pay | Admitting: Internal Medicine

## 2024-07-26 ENCOUNTER — Ambulatory Visit: Attending: Cardiology | Admitting: Internal Medicine

## 2024-07-26 VITALS — BP 130/76 | HR 90 | Resp 17 | Ht 74.0 in | Wt 250.0 lb

## 2024-07-26 DIAGNOSIS — T466X5D Adverse effect of antihyperlipidemic and antiarteriosclerotic drugs, subsequent encounter: Secondary | ICD-10-CM

## 2024-07-26 DIAGNOSIS — I4821 Permanent atrial fibrillation: Secondary | ICD-10-CM

## 2024-07-26 DIAGNOSIS — G4733 Obstructive sleep apnea (adult) (pediatric): Secondary | ICD-10-CM

## 2024-07-26 DIAGNOSIS — T466X5A Adverse effect of antihyperlipidemic and antiarteriosclerotic drugs, initial encounter: Secondary | ICD-10-CM

## 2024-07-26 DIAGNOSIS — M791 Myalgia, unspecified site: Secondary | ICD-10-CM

## 2024-07-26 DIAGNOSIS — I10 Essential (primary) hypertension: Secondary | ICD-10-CM

## 2024-07-26 DIAGNOSIS — I5022 Chronic systolic (congestive) heart failure: Secondary | ICD-10-CM

## 2024-07-26 DIAGNOSIS — E785 Hyperlipidemia, unspecified: Secondary | ICD-10-CM

## 2024-07-26 NOTE — Patient Instructions (Signed)
 Medication Instructions:  Continue current medications  *If you need a refill on your cardiac medications before your next appointment, please call your pharmacy*  Lab Work: Stop by the lab on the first floor today   If you have labs (blood work) drawn today and your tests are completely normal, you will receive your results only by: MyChart Message (if you have MyChart) OR A paper copy in the mail If you have any lab test that is abnormal or we need to change your treatment, we will call you to review the results.  Follow-Up: At [facility], you and your health needs are our priority.  As part of our continuing mission to provide you with exceptional heart care, our providers are all part of one team.  This team includes your primary Cardiologist (physician) and Advanced Practice Providers or APPs (Physician Assistants and Nurse Practitioners) who all work together to provide you with the care you need, when you need it.  Your next appointment:    6 months with Dr. Mona Lanius have been referred to the Clinical Pharmacy team to discuss ZEPBOUND  We recommend signing up for the patient portal called MyChart.  Sign up information is provided on this After Visit Summary.  MyChart is used to connect with patients for Virtual Visits (Telemedicine).  Patients are able to view lab/test results, encounter notes, upcoming appointments, etc.  Non-urgent messages can be sent to your provider as well.   To learn more about what you can do with MyChart, go to [email or web address].   Other Instructions

## 2024-07-26 NOTE — Progress Notes (Signed)
 OFFICE NOTE  Chief Complaint:  Follow-up  Primary Care Physician: Charolet Yvonna SQUIBB, PA-C  HPI:  Omar Byrd is a 73 y.o. male with a past medial history significant for chronic systolic congestive heart failure and atrial fibrillation.  He presented in acute systolic congestive heart failure with rapid atrial fibrillation around 2010.  LVEF at that time was about 15%.  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  Cardiac catheterization was performed and showed normal coronaries.  Subsequently over time and with medical therapy his LVEF had improved up to 35 to 40% in 2021 by echo and up to 50% in 2024.  He is also noted to have left atrial and right atrial enlargement as well as diastolic dysfunction.  Medical therapy for heart failure is included ARB, beta-blocker and digoxin.  He reports over the past several years she has had poor blood pressure control.  Today blood pressure is 153/101.  He is recent blood pressure in the new primary care provider's office was 140/100.  He was just seen by cardiology 4 days ago at the [facility] and they recommended adding amlodipine  5 mg daily to his medical regimen.  Blood pressure then was 150/102.  He did have extensive lab work which showed normal CBC and BMET, total cholesterol 239, triglyceride 126, HDL 43 and LDL 171.  He has been intolerant to statins in the past.  He was prescribed Repatha  but never had the medicine filled due to cost issues.  He said he never got a prior authorization for it.  The previous cardiologist had worked to try to get him on Leqvio however that was never established either.  He also has a history of prostate cancer and recent PSA was still undetectable.  Currently says he is asymptomatic denying chest pain or shortness of breath with exertion.  Denies any swelling or nonproductive cough, orthopnea or heart failure symptoms.  He remains very active and has no limiting  symptoms.  07/26/2024  Omar Byrd is seen today in follow-up.  Overall he seems to be doing pretty well.  He did have to stop Entresto  because he felt washed out.  It turns out that his blood pressure actually was quite low in the 90s systolic.  He probably he was over treated with his combination medicines before.  He has subsequently remained on the carvedilol  and amlodipine  and his blood pressure is well-controlled today.  He is also on digoxin.  His level was normal.  He has not had repeat lipid testing.  He remains on the Repatha .  He seems to be tolerating this well.  I expect a marked improvement in his cholesterol.  As mentioned above his last echo showed improvement up to 50% in 2024.  Will plan a repeat echo likely next year.  PMHx:  Past Medical History:  Diagnosis Date   Atrial fibrillation (HCC)    CHF (congestive heart failure) (HCC)    Hyperlipidemia    Hypertension    Prostate cancer (HCC)     History reviewed. No pertinent surgical history.  FAMHx:  History reviewed. No pertinent family history.  SOCHx:   reports that he has never smoked. He has never used smokeless tobacco. He reports current alcohol use of about 2.0 - 4.0 standard drinks of alcohol per week. He reports that he does not use drugs.  ALLERGIES:  Allergies  Allergen Reactions   Ambien  [Zolpidem]     Other reaction(s): Hallucinations  Fentanyl     Other Reaction(s): Other  sneezing   Diphenhydramine Hcl    Simvastatin     Muscle aches    ROS: Pertinent items noted in HPI and remainder of comprehensive ROS otherwise negative.  HOME MEDS: Current Outpatient Medications on File Prior to Visit  Medication Sig Dispense Refill   ALPRAZolam (XANAX) 0.5 MG tablet Take 0.5 mg by mouth at bedtime as needed.     amLODipine  (NORVASC ) 5 MG tablet Take 1 tablet (5 mg total) by mouth daily. For BP 90 tablet 3   amoxicillin (AMOXIL) 500 MG capsule Take 1,000 mg by mouth 2 (two) times daily as needed  (dental work).     carvedilol  (COREG ) 25 MG tablet Take 1 tablet (25 mg total) by mouth 2 (two) times daily. 180 tablet 3   cetirizine (RA CETIRIZINE) 10 MG tablet Take by mouth.     chlorhexidine (PERIDEX) 0.12 % solution Use as directed 5 mLs in the mouth or throat 2 (two) times daily.     digoxin (LANOXIN) 0.25 MG tablet Take 0.25 mg by mouth daily.     ELIQUIS 5 MG TABS tablet Take 5 mg by mouth 2 (two) times daily.     escitalopram (LEXAPRO) 10 MG tablet Take 10 mg by mouth daily.     Evolocumab  (REPATHA  SURECLICK) 140 MG/ML SOAJ Inject 140 mg into the skin every 14 (fourteen) days. 6 mL 3   mometasone (NASONEX) 50 MCG/ACT nasal spray Place 2 sprays into the nose daily as needed (allergies).     sacubitril -valsartan  (ENTRESTO ) 49-51 MG Take 1 tablet by mouth 2 (two) times daily. (Patient not taking: Reported on 07/26/2024) 180 tablet 3   No current facility-administered medications on file prior to visit.    LABS/IMAGING: No results found for this or any previous visit (from the past 48 hours). No results found.  LIPID PANEL: No results found for: CHOL, TRIG, HDL, CHOLHDL, VLDL, LDLCALC, LDLDIRECT   WEIGHTS: Wt Readings from Last 3 Encounters:  07/26/24 250 lb (113.4 kg)  05/02/24 247 lb 4.8 oz (112.2 kg)  12/04/23 249 lb (112.9 kg)    VITALS: BP 130/76 (BP Location: Left Arm, Patient Position: Sitting, Cuff Size: Large)   Pulse 90   Resp 17   Ht 6' 2 (1.88 m)   Wt 250 lb (113.4 kg)   SpO2 97%   BMI 32.10 kg/m   EXAM: General appearance: alert and no distress Neck: no carotid bruit, no JVD, and thyroid not enlarged, symmetric, no tenderness/mass/nodules Lungs: clear to auscultation bilaterally Heart: regular rate and rhythm, S1, S2 normal, no murmur, click, rub or gallop Abdomen: soft, non-tender; bowel sounds normal; no masses,  no organomegaly Extremities: extremities normal, atraumatic, no cyanosis or edema Pulses: 2+ and symmetric Skin: Skin color,  texture, turgor normal. No rashes or lesions Neurologic: Grossly normal Psych: Pleasant  EKG: Deferred  ASSESSMENT: Chronic systolic heart failure, LVEF 50% (2024), improved from 15% in 2010, NYHA class I symptoms Permanent atrial fibrillation on Eliquis Angiographically normal coronaries by cath in 2011 Uncontrolled hypertension Dyslipidemia, goal LDL less than 70 Statin intolerance-myalgias History of prostate cancer Moderate OSA-intolerant to CPAP  PLAN: 1.   Omar Byrd seems to be doing better.  Once established on multiple medicines for systolic hypertension he became hypotensive and was symptomatic with that.  He stopped the Entresto .  I suspect he did not tolerate the diuretic portion of that.  Currently on amlodipine  and carvedilol .  Blood pressure is well-controlled today.  He is hesitant to switch medications and understands the amlodipine  does not have any direct heart failure benefits.  He is rate controlled in A-fib on low-dose digoxin.  He started on Repatha  and will plan repeat lipids today.  He is also interested in weight loss.  He does have a history of moderate obstructive sleep apnea but is intolerant to CPAP.  Given his obesity and sleep apnea he is interested in seeing if he would qualify for Zepbound.  Will refer him to our Pharm.D.'s to see if we can get this started.  He says he has an upcoming cataract surgery.  If necessary could hold Eliquis 3 days prior to that procedure and restart after.  He also can do that for upcoming colonoscopy.  He said he was advised to ask if he could stop his carvedilol  for allergy testing.  I would not recommend that because of his heart failure history and hypertension.  Plan follow-up in 6 months or sooner as necessary  Omar KYM Maxcy, MD, [facility], FNLA, FACP  [place]  [facility]  Medical Director of the [facility] Lipid [facility] &  Cardiovascular Risk Reduction Clinic Diplomate of the American Board of Clinical  Lipidology Attending Cardiologist  Direct Dial: [phone]  Fax: [phone]  Website:  [email or web address] Omar Byrd 07/26/2024, 8:05 AM

## 2024-07-27 LAB — LIPOPROTEIN A (LPA): Lipoprotein (a): <8.4 nmol/L (ref <75.0)

## 2024-07-27 LAB — NMR, LIPOPROFILE
Cholesterol, Total: 128 mg/dL (ref 100–199)
HDL Particle Number: 36.7 umol/L (ref >=30.5)
HDL-C: 50 mg/dL (ref >39)
LDL Particle Number: 594 nmol/L (ref <1000)
LDL Size: 20.3 nm — ABNORMAL LOW (ref >20.5)
LDL-C (NIH Calc): 58 mg/dL (ref 0–99)
LP-IR Score: 63 — ABNORMAL HIGH (ref <=45)
Small LDL Particle Number: 266 nmol/L (ref <=527)
Triglycerides: 107 mg/dL (ref 0–149)

## 2024-07-31 ENCOUNTER — Ambulatory Visit (HOSPITAL_BASED_OUTPATIENT_CLINIC_OR_DEPARTMENT_OTHER): Payer: Self-pay | Admitting: Internal Medicine

## 2024-08-15 ENCOUNTER — Telehealth (HOSPITAL_BASED_OUTPATIENT_CLINIC_OR_DEPARTMENT_OTHER): Payer: Self-pay

## 2024-08-15 NOTE — Telephone Encounter (Signed)
"  ° °  Patient Name: Omar Byrd  DOB: [birth year] MRN: [id number]  Primary Cardiologist: None  Chart reviewed as part of pre-operative protocol coverage. Given past medical history and time since last visit, based on ACC/AHA guidelines, [name] is at acceptable risk for the planned procedure without further cardiovascular testing.   Per Dr. Mona 07/26/2024 He says he has an upcoming cataract surgery. If necessary could hold Eliquis 3 days prior to that procedure and restart after. He also can do that for upcoming colonoscopy.    The patient was advised that if he develops new symptoms prior to surgery to contact our office to arrange for a follow-up visit, and he verbalized understanding.  I will route this recommendation to the requesting party via Epic fax function and remove from pre-op pool.  Please call with questions.  Lamarr Satterfield, NP 08/15/2024, 10:33 AM  "

## 2024-08-15 NOTE — Telephone Encounter (Signed)
"  ° °  Pre-operative Risk Assessment    Patient Name: Omar Byrd  DOB: May 24, 1951 MRN: [id number]   Date of last office visit: 07/26/2024, Dr. Vinie Maxcy, MD Date of next office visit: NONE   Request for Surgical Clearance    Procedure:  Colonoscopy  Date of Surgery:  Clearance 09/12/24                                Surgeon: Dr. Melonie Surgeon's Group or Practice Name: [facility]  Phone number: [phone] Fax number: [phone]   Type of Clearance Requested:   - Medical  - Pharmacy:  Hold Apixaban (Eliquis) 2 days   Type of Anesthesia:  Not Indicated   Additional requests/questions:    [name] [name]   08/15/2024, 10:17 AM   "

## 2024-08-26 ENCOUNTER — Ambulatory Visit: Payer: Medicare (Managed Care) | Attending: Cardiology

## 2024-08-26 VITALS — Ht 74.0 in | Wt 254.2 lb

## 2024-08-26 DIAGNOSIS — G4733 Obstructive sleep apnea (adult) (pediatric): Secondary | ICD-10-CM

## 2024-08-26 NOTE — Patient Instructions (Signed)
 Omar Byrd Omar Byrd, Pharm.D, CPP [place] Omar Byrd. [facility] [address], [place], [place] [address] Phone: [phone]; Fax: [phone]    GLP-1 Receptor Agonist Counseling Points This medication reduces your appetite and may make you feel fuller longer.  Stop eating when your body tells you that you are full. This will likely happen sooner than you are used to. Fried/greasy food and sweets may upset your stomach - minimize these as much as possible. Store your medication in the fridge until you are ready to use it. Inject your medication in the fatty tissue of your lower abdominal area (2 inches away from belly button) or upper outer thigh. Rotate injection sites. Common side effects include: nausea, diarrhea/constipation, and heartburn, and are more likely to occur if you overeat. Stop your injection for 7 days prior to surgical procedures requiring anesthesia.  Dosing schedule:  We will touch base with you monthly over the phone. The medication can be increased in monthly intervals depending on tolerability and efficacy.  Tips for success: Write down the reasons why you want to lose weight and post it in a place where you'll see it often.  Start small and work your way up. Keep in mind that it takes time to achieve goals, and small steps add up.  Any additional movements help to burn calories. Taking the stairs rather than the elevator and parking at the far end of your parking lot are easy ways to start. Brisk walking for at least 30 minutes 4 or more days of the week is an excellent goal to work toward  Understanding what it means to feel full: Did you know that it can take 15 minutes or more for your brain to receive the message that you've eaten? That means that, if you eat less food, but consume it slower, you may still feel satisfied.  Eating a lot of fruits and vegetables can also help you feel fuller.  Eat off of smaller plates so  that moderate portions don't seem too small  Tips for living a healthier life     Building a Healthy and Balanced Diet Make most of your meal vegetables and fruits -  of your plate. Aim for color and variety, and remember that potatoes dont count as vegetables on the Healthy Eating Plate because of their negative impact on blood sugar.  Go for whole grains -  of your plate. Whole and intact grains--whole wheat, barley, wheat berries, quinoa, oats, brown rice, and foods made with them, such as whole wheat pasta--have a milder effect on blood sugar and insulin than Omar Byrd bread, Omar Byrd rice, and other refined grains.  Protein power -  of your plate. Fish, poultry, beans, and nuts are all healthy, versatile protein sources--they can be mixed into salads, and pair well with vegetables on a plate. Limit red meat, and avoid processed meats such as bacon and sausage.  Healthy plant oils - in moderation. Choose healthy vegetable oils like olive, canola, soy, corn, sunflower, peanut, and others, and avoid partially hydrogenated oils, which contain unhealthy trans fats. Remember that low-fat does not mean healthy.  Drink water, coffee, or tea. Skip sugary drinks, limit milk and dairy products to one to two servings per day, and limit juice to a small glass per day.  Stay active. The red figure running across the Healthy Eating Plates placemat is a reminder that staying active is also important in weight control.  The main message of  the Healthy Eating Plate is to focus on diet quality:  The type of carbohydrate in the diet is more important than the amount of carbohydrate in the diet, because some sources of carbohydrate--like vegetables (other than potatoes), fruits, whole grains, and beans--are healthier than others. The Healthy Eating Plate also advises consumers to avoid sugary beverages, a major source of calories--usually with little nutritional value--in the American diet. The Healthy  Eating Plate encourages consumers to use healthy oils, and it does not set a maximum on the percentage of calories people should get each day from healthy sources of fat. In this way, the Healthy Eating Plate recommends the opposite of the low-fat message promoted for decades by the USDA.  cuetune.com.ee  SUGAR  Sugar is a huge problem in the modern day diet. Sugar is a big contributor to heart disease, diabetes, high triglyceride levels, fatty liver disease and obesity. Sugar is hidden in almost all packaged foods/beverages. Added sugar is extra sugar that is added beyond what is naturally found and has no nutritional benefit for your body. The American Heart Association recommends limiting added sugars to no more than 25g for women and 36 grams for men per day. There are many names for sugar including maltose, sucrose (names ending in ose), high fructose corn syrup, molasses, cane sugar, corn sweetener, raw sugar, syrup, honey or fruit juice concentrate.   One of the best ways to limit your added sugars is to stop drinking sweetened beverages such as soda, sweet tea, and fruit juice.  There is 65g of added sugars in one 20oz bottle of Coke! That is equal to 7.5 donuts.   Pay attention and read all nutrition facts labels. Below is an examples of a nutrition facts label. The #1 is showing you the total sugars where the # 2 is showing you the added sugars. This one serving has almost the max amount of added sugars per day!   EXERCISE  Exercise is good. Weve all heard that. In an ideal world, we would all have time and resources to get plenty of it. When you are active, your heart pumps more efficiently and you will feel better.  Multiple studies show that even walking regularly has benefits that include living a longer life. The American Heart Association recommends 150 minutes per week of exercise (30 minutes per day most days of the week). You  can do this in any increment you wish. Nine or more 10-minute walks count. So does an hour-long exercise class. Break the time apart into what will work in your life. Some of the best things you can do include walking briskly, jogging, cycling or swimming laps. Not everyone is ready to exercise. Sometimes we need to start with just getting active. Here are some easy ways to be more active throughout the day:  Take the stairs instead of the elevator  Go for a 10-15 minute walk during your lunch break (find a friend to make it more enjoyable)  When shopping, park at the back of the parking lot  If you take public transportation, get off one stop early and walk the extra distance  Pace around while making phone calls  Check with your doctor if you arent sure what your limitations may be. Always remember to drink plenty of water when doing any type of exercise. Dont feel like a failure if youre not getting the 90-150 minutes per week. If you started by being a couch potato, then just a 10-minute walk each day  is a huge improvement. Start with little victories and work your way up.   HEALTHY EATING TIPS              Plan ahead: make a menu of the meals for a week then create a grocery list to go with that menu. Consider meals that easily stretch into a night of leftovers, such as stews or casseroles. Or consider making two of your favorite meal and put one in the freezer for another night. Try a night or two each week that is meatless or no cook such as salads. When you get home from the grocery store wash and prepare your vegetables and fruits. Then when you need them they are ready to go.   Tips for going to the grocery store:  Buy store or generic brands  Check the weekly ad from your store on-line or in their in-store flyer  Look at the unit price on the shelf tag to compare/contrast the costs of different items  Buy fruits/vegetables in season  Carrots, bananas and apples are low-cost,  naturally healthy items  If meats or frozen vegetables are on sale, buy some extras and put in your freezer  Limit buying prepared or ready to eat items, even if they are pre-made salads or fruit snacks  Do not shop when youre hungry  Foods at eye level tend to be more expensive. Look on the high and low shelves for deals.  Consider shopping at the farmers market for fresh foods in season.  Avoid the cookie and chip aisles (these are expensive, high in calories and low in nutritional value). Shop on the outside of the grocery store.  Healthy food preparations:  If you cant get lean hamburger, be sure to drain the fat when cooking  Steam, saut (in olive oil), grill or bake foods  Experiment with different seasonings to avoid adding salt to your foods. Kosher salt, sea salt and Himalayan salt are all still salt and should be avoided. Try seasoning food with onion, garlic, thyme, rosemary, basil ect. Onion powder or garlic powder is ok. Avoid if it says salt (ie garlic salt).

## 2024-08-26 NOTE — Progress Notes (Signed)
 Patient ID: Omar Byrd                 DOB: 19-Jun-1951                    MRN: [id number]     HPI: Omar Byrd is a 74 y.o. male patient referred to pharmacy clinic by Dr. Mona to initiate GLP1-RA therapy. PMH is significant for atrial fibrillation, HTN, chronic systolic heart failure, HLD, moderate OSA, and obesity. Most recent BMI 32.64 kg/m . Most recent A1c 5.7%.  Patient last seen by Dr. Mona in December 2025; Given his obesity and sleep apnea he is interested in seeing if he would qualify for Zepbound.   Patient presents today for PharmD visit to discuss GLP-1RA therapy. He reports he recently had a sleep study a month ago with [facility]. I am unable to see results of sleep study and have asked him to provide this before proceeding with Zepbound PA.   Current weight and BMI: 254.6 lbs and 32.64 kg/m  Goal weight: 215-220 lbs  Current meds that affect weight: none   Diet: Describes himself as a healthy eater but notes that, being Italian, he consumes a lot of pasta. Breakfast: Typically skips; when eaten, options include oatmeal, cereal, eggs, or Canadian bacon. Lunch/Dinner: Common choices include sushi, hamburgers, chicken; rarely eats processed foods. Prefers grilled items and includes vegetables and greens along with pasta. Beverages: Drinks 4-6 bottles of water daily    Exercise:  No structured exercise but he runs two comparies and remains active, walking around the warehouse   Social History: Alcohol: socially drinks Smoking: none   Labs: No results found for: HGBA1C  Wt Readings from Last 1 Encounters:  07/26/24 250 lb (113.4 kg)    BP Readings from Last 1 Encounters:  07/26/24 130/76   Pulse Readings from Last 1 Encounters:  07/26/24 90    No results found for: CHOL, TRIG, HDL, CHOLHDL, VLDL, LDLCALC, LDLDIRECT  Past Medical History:  Diagnosis Date   Atrial fibrillation (HCC)    CHF (congestive heart failure) (HCC)     Hyperlipidemia    Hypertension    Prostate cancer (HCC)     Medications Ordered Prior to Encounter[1]  Allergies[2]   Assessment/Plan:  1. Weight loss - Patient has not met goal of at least 5% of body weight loss with comprehensive lifestyle modifications alone in the past 3-6 months. Pharmacotherapy is appropriate to pursue as augmentation. Will start Zepbound . Confirmed patient does not have personal or family history of medullary thyroid carcinoma (MTC) or Multiple Endocrine Neoplasia syndrome type 2 (MEN 2). Injection technique reviewed at today's visit.  Advised patient on common side effects including nausea, diarrhea, dyspepsia, decreased appetite, and fatigue. Counseled patient on reducing meal size and how to titrate medication to minimize side effects. Counseled patient to call if intolerable side effects or if experiencing dehydration, abdominal pain, or dizziness. Along with pharmacotherapy, the patient will follow dietary modifications and aim for at least 150 minutes of moderate-intensity exercise per week, plus resistance training twice a week (as recommended by the American Heart Association). This resistance training--such as weightlifting, bodyweight exercises, or using resistance bands, adapted to the patients ability--will help prevent muscle loss.  Follow up regarding receipt of sleep study. If therapy is initiated, phone follow-ups will be conducted every 4 weeks for dose titration until the patient reaches the effective therapeutic dose and target weight.  Sarit Sparano E. Kalyb Pemble, Pharm.D, CPP [place] [facility]eth BIRCH. [place]  [facility] [address], [place], [place] [address] Phone: [phone]; Fax: [phone]      [1]  Current Outpatient Medications on File Prior to Visit  Medication Sig Dispense Refill   ALPRAZolam (XANAX) 0.5 MG tablet Take 0.5 mg by mouth at bedtime as needed.     amLODipine  (NORVASC ) 5 MG tablet Take 1 tablet (5 mg  total) by mouth daily. For BP 90 tablet 3   amoxicillin (AMOXIL) 500 MG capsule Take 1,000 mg by mouth 2 (two) times daily as needed (dental work).     carvedilol  (COREG ) 25 MG tablet Take 1 tablet (25 mg total) by mouth 2 (two) times daily. 180 tablet 3   cetirizine (RA CETIRIZINE) 10 MG tablet Take by mouth.     chlorhexidine (PERIDEX) 0.12 % solution Use as directed 5 mLs in the mouth or throat 2 (two) times daily.     digoxin (LANOXIN) 0.25 MG tablet Take 0.25 mg by mouth daily.     ELIQUIS 5 MG TABS tablet Take 5 mg by mouth 2 (two) times daily.     escitalopram (LEXAPRO) 10 MG tablet Take 10 mg by mouth daily.     Evolocumab  (REPATHA  SURECLICK) 140 MG/ML SOAJ Inject 140 mg into the skin every 14 (fourteen) days. 6 mL 3   mometasone (NASONEX) 50 MCG/ACT nasal spray Place 2 sprays into the nose daily as needed (allergies).     No current facility-administered medications on file prior to visit.  [2]  Allergies Allergen Reactions   Ambien  [Zolpidem]     Other reaction(s): Hallucinations   Fentanyl     Other Reaction(s): Other  sneezing   Diphenhydramine Hcl    Simvastatin     Muscle aches

## 2024-08-30 ENCOUNTER — Telehealth: Payer: Self-pay

## 2024-08-31 ENCOUNTER — Other Ambulatory Visit (HOSPITAL_COMMUNITY): Payer: Self-pay

## 2024-08-31 ENCOUNTER — Telehealth: Payer: Self-pay | Admitting: Pharmacy Technician

## 2024-08-31 NOTE — Telephone Encounter (Signed)
 Pharmacy Patient Advocate Encounter   Received notification from Patient Advice Request messages that prior authorization for zepbound 2.5 MG is required/requested.   Insurance verification completed.   The patient is insured through [employer].   Per test claim: PA required; PA submitted to above mentioned insurance via Latent Key/confirmation #/EOC [id number] Status is pending

## 2024-09-01 ENCOUNTER — Other Ambulatory Visit (HOSPITAL_COMMUNITY): Payer: Self-pay

## 2024-09-01 NOTE — Telephone Encounter (Signed)
 Pharmacy Patient Advocate Encounter  Received notification from [employer] that Prior Authorization for zepbound has been APPROVED from 08/18/24 to 08/31/25. Ran test claim, Copay is $627.89-- one month. This test claim was processed through [facility]- copay amounts may vary at other pharmacies due to pharmacy/plan contracts, or as the patient moves through the different stages of their insurance plan.   PA #/Case ID/Reference #: [id number]

## 2024-09-01 NOTE — Telephone Encounter (Signed)
 Provided the patient with information showing a Zepbound copay of approximately $600 per month. He plans to contact his insurance company to obtain an estimate for costs in subsequent months and will follow up with me once he has more information.

## 2024-09-01 NOTE — Telephone Encounter (Signed)
 Please see other encounter.

## 2024-09-08 MED ORDER — ZEPBOUND 2.5 MG/0.5ML ~~LOC~~ SOAJ: 2.5000 mg | SUBCUTANEOUS | 0 refills | Status: AC

## 2024-09-08 NOTE — Addendum Note (Signed)
 Addended by: Odie Edmonds E on: 09/08/2024 10:45 AM   Modules accepted: Orders

## 2024-09-08 NOTE — Telephone Encounter (Signed)
 Patient called back and would like to proceed with Zepbound . Will send Zepbound  [facility] mg [facility]. Patient to call monthly for follow ups.

## 2024-09-15 ENCOUNTER — Telehealth: Payer: Self-pay | Admitting: Internal Medicine

## 2024-09-15 ENCOUNTER — Telehealth: Payer: Self-pay

## 2024-09-15 MED ORDER — CARVEDILOL 25 MG PO TABS: 25.0000 mg | ORAL_TABLET | Freq: Two times a day (BID) | ORAL | 3 refills | Status: AC

## 2024-09-15 MED ORDER — REPATHA SURECLICK 140 MG/ML ~~LOC~~ SOAJ: 140.0000 mg | SUBCUTANEOUS | 3 refills | Status: AC

## 2024-09-15 MED ORDER — ELIQUIS 5 MG PO TABS: 5.0000 mg | ORAL_TABLET | Freq: Two times a day (BID) | ORAL | 3 refills | Status: AC

## 2024-09-15 MED ORDER — AMLODIPINE BESYLATE 5 MG PO TABS: 5.0000 mg | ORAL_TABLET | Freq: Every day | ORAL | 3 refills | Status: AC

## 2024-09-15 MED ORDER — DIGOXIN 250 MCG PO TABS: 0.2500 mg | ORAL_TABLET | Freq: Every day | ORAL | 3 refills | Status: AC

## 2024-09-15 NOTE — Telephone Encounter (Signed)
 Refills has been sent to the pharmacy.

## 2024-09-15 NOTE — Telephone Encounter (Signed)
 I spoke with the patient and advised him to contact [facility] and request that they call [facility] to transfer the prescription. He agreed to do so and verbalized understanding. [facility] is now listed as preferred pharmacy.

## 2024-09-15 NOTE — Telephone Encounter (Signed)
" °*  STAT* If patient is at the pharmacy, call can be transferred to refill team.   1. Which medications need to be refilled? (please list name of each medication and dose if known) carvedilol  (COREG ) 25 MG tablet (Expired)   ELIQUIS  5 MG TABS tablet    Evolocumab  (REPATHA  SURECLICK) 140 MG/ML SOAJ    amLODipine  (NORVASC ) 5 MG tablet  digoxin  (LANOXIN ) 0.25 MG tablet   2. Would you like to learn more about the convenience, safety, & potential cost savings by using the [facility]? No      3. Are you open to using the [facility] ([facility]. No    4. Which pharmacy/location (including street and city if local pharmacy) is medication to be sent to? [id number] - [place], [place] - [address]     5. Do they need a 30 day or 90 day supply? 90 days   "

## 2024-09-15 NOTE — Telephone Encounter (Signed)
 Pt c/o medication issue:  1. Name of Medication:   tirzepatide  (ZEPBOUND ) 2.5 MG/0.5ML Pen    2. How are you currently taking this medication (dosage and times per day)? As prescribed   3. Are you having a reaction (difficulty breathing--STAT)? No   4. What is your medication issue? [id number] - [place], [place] - [address]   Pt asking medication be [facility].
